# Patient Record
Sex: Male | Born: 1975 | Race: White | Hispanic: No | Smoking: Former smoker
Health system: Southern US, Community
[De-identification: ages and names within clinical notes are randomized; demographics above are authoritative.]

## PROBLEM LIST (undated history)

## (undated) DIAGNOSIS — F419 Anxiety disorder, unspecified: Secondary | ICD-10-CM

## (undated) DIAGNOSIS — I10 Essential (primary) hypertension: Secondary | ICD-10-CM

## (undated) HISTORY — PX: SHOULDER SURGERY: SHX246

---

## 2013-08-02 ENCOUNTER — Emergency Department: Payer: Self-pay | Admitting: Emergency Medicine

## 2015-02-02 ENCOUNTER — Emergency Department: Payer: 59

## 2015-02-02 ENCOUNTER — Emergency Department
Admission: EM | Admit: 2015-02-02 | Discharge: 2015-02-02 | Disposition: A | Discharge status: Home / Self Care | Payer: 59 | Attending: Student | Admitting: Student

## 2015-02-02 ENCOUNTER — Encounter: Payer: Self-pay | Admitting: Emergency Medicine

## 2015-02-02 DIAGNOSIS — Z720 Tobacco use: Secondary | ICD-10-CM | POA: Insufficient documentation

## 2015-02-02 DIAGNOSIS — Y9289 Other specified places as the place of occurrence of the external cause: Secondary | ICD-10-CM | POA: Diagnosis not present

## 2015-02-02 DIAGNOSIS — S46911A Strain of unspecified muscle, fascia and tendon at shoulder and upper arm level, right arm, initial encounter: Secondary | ICD-10-CM | POA: Insufficient documentation

## 2015-02-02 DIAGNOSIS — Y9389 Activity, other specified: Secondary | ICD-10-CM | POA: Insufficient documentation

## 2015-02-02 DIAGNOSIS — Y998 Other external cause status: Secondary | ICD-10-CM | POA: Insufficient documentation

## 2015-02-02 DIAGNOSIS — X58XXXA Exposure to other specified factors, initial encounter: Secondary | ICD-10-CM | POA: Diagnosis not present

## 2015-02-02 DIAGNOSIS — S4991XA Unspecified injury of right shoulder and upper arm, initial encounter: Secondary | ICD-10-CM | POA: Diagnosis present

## 2015-02-02 MED ORDER — IBUPROFEN 600 MG PO TABS: 600.0000 mg | ORAL_TABLET | Freq: Four times a day (QID) | ORAL | Status: DC | PRN

## 2015-02-02 MED ORDER — TRAMADOL HCL 50 MG PO TABS: 50.0000 mg | ORAL_TABLET | Freq: Three times a day (TID) | ORAL | Status: DC | PRN

## 2015-02-02 MED ORDER — MORPHINE SULFATE 4 MG/ML IJ SOLN
4.0000 mg | Freq: Once | INTRAMUSCULAR | Status: AC
  Administered 2015-02-02: 4 mg via INTRAVENOUS

## 2015-02-02 MED ORDER — MORPHINE SULFATE 4 MG/ML IJ SOLN
INTRAMUSCULAR | Status: AC
  Administered 2015-02-02: 4 mg via INTRAVENOUS
  Filled 2015-02-02: qty 1

## 2015-02-02 MED ORDER — ONDANSETRON HCL 4 MG/2ML IJ SOLN
4.0000 mg | Freq: Once | INTRAMUSCULAR | Status: AC
  Administered 2015-02-02: 4 mg via INTRAVENOUS

## 2015-02-02 MED ORDER — ONDANSETRON HCL 4 MG/2ML IJ SOLN
INTRAMUSCULAR | Status: AC
  Administered 2015-02-02: 4 mg via INTRAVENOUS
  Filled 2015-02-02: qty 2

## 2015-02-02 NOTE — ED Notes (Signed)
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

## 2015-02-02 NOTE — ED Notes (Signed)
AAOx3.  Skin warm and dry.  Patient posture relaxed.  Appears to be in no acute distress.  D/C instructions given. Patient d/c home with ride.

## 2015-02-02 NOTE — ED Provider Notes (Signed)
[facility] Emergency Department Provider Note  ____________________________________________  Time seen: Approximately 5:26 PM  I have reviewed the triage vital signs and the nursing notes.   HISTORY  Chief Complaint Shoulder Pain    HPI James Tapia is a 39 y.o. 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 The patient reports that he was loading a lawnmower onto a truck when the lawnmower "came back at me". He tried to steady it with his hands and "heard a pop" in the right shoulder. He is concerned it may be dislocated. He has suffered multiple dislocations before in the past. He has no numbness or weakness in the hand. Current pain severity is moderate. Movement makes the pain worse.   History reviewed. No pertinent past medical history.  There are no active problems to display for this patient.   History reviewed. No pertinent past surgical history.  No current outpatient prescriptions on file.  Allergies Nsaids  No family history on file.  Social History History  Substance Use Topics  . Smoking status: Current Every Day Smoker  . Smokeless tobacco: Not on file  . Alcohol Use: No    Review of Systems Constitutional: No fever/chills Eyes: No visual changes. ENT: No sore throat. Cardiovascular: Denies chest pain. Respiratory: Denies shortness of breath. Gastrointestinal: No abdominal pain.  No nausea, no vomiting.  No diarrhea.  No constipation. Genitourinary: Negative for dysuria. Musculoskeletal: Negative for back pain. Skin: Negative for rash. Neurological: Negative for headaches, focal weakness or numbness.  10-point ROS otherwise negative.  ____________________________________________   PHYSICAL EXAM:  VITAL SIGNS: ED Triage Vitals  Enc Vitals Group     BP 02/02/15 1719 135/81 mmHg     Pulse Rate 02/02/15 1719  93     Resp --      Temp 02/02/15 1719 98.5 F (36.9 C)     Temp Source 02/02/15 1719 Oral     SpO2 02/02/15 1719 98 %     Weight 02/02/15 1719 235 lb (106.595 kg)     Height 02/02/15 1719 6' (1.829 m)     Head Cir --      Peak Flow --      Pain Score 02/02/15 1719 10     Pain Loc --      Pain Edu? --      Excl. in GC? --     Constitutional: Alert and oriented. Well appearing and in no acute distress. Eyes: Conjunctivae are normal. PERRL. EOMI. Head: Atraumatic. Nose: No congestion/rhinnorhea. Mouth/Throat: Mucous membranes are moist.  Oropharynx non-erythematous. Neck: No stridor.   Cardiovascular: Normal rate, regular rhythm. Grossly normal heart sounds.  Good peripheral circulation. Respiratory: Normal respiratory effort.  No retractions. Lungs CTAB. Gastrointestinal: Soft and nontender. No distention. No abdominal bruits. No CVA tenderness. Genitourinary: deferred Musculoskeletal: No bony tenderness associated with the right shoulder and the patient has full although painful passive range of motion of the right shoulder, 2+ right radial pulse, wiggles the fingers, radial/median/ulnar nerve are intact. Neurologic:  Normal speech and language. No gross focal neurologic deficits are appreciated. Speech is normal. No gait instability. Skin:  Skin is warm, dry and intact. No rash noted. Psychiatric: Mood and affect are normal. Speech and behavior are normal.  ____________________________________________   LABS (all labs ordered are listed, but only abnormal results are displayed)  Labs Reviewed - No data to display ____________________________________________  EKG  none  ____________________________________________  RADIOLOGY  FINDINGS: There is no evidence of fracture or dislocation. There is no evidence of arthropathy or other focal bone abnormality. Soft tissues are  unremarkable.  IMPRESSION: Negative. ____________________________________________   PROCEDURES  Procedure(s) performed: None  Critical Care performed: No  ____________________________________________   INITIAL IMPRESSION / ASSESSMENT AND PLAN / ED COURSE  Pertinent labs & imaging results that were available during my care of the patient were reviewed by me and considered in my medical decision making (see chart for details).  James Tapia is a 39 y.o. 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 On exam, he is very well-appearing and in no acute distress. His pain has improved significantly after 1 dose of morphine. Neurovascularly intact in the right upper extremity. Physical exam and x-rays not consistent with dislocation or fracture. Discussed symptomatic care, anti-inflammatories, follow-up with orthopedic surgery. Discussed return precautions and patient is comfortable with discharge plan. ____________________________________________   FINAL CLINICAL IMPRESSION(S) / ED DIAGNOSES  Final diagnoses:  Shoulder strain, right, initial encounter      Gayla Doss, MD 02/02/15 1858

## 2015-04-20 ENCOUNTER — Encounter: Payer: Self-pay | Admitting: Emergency Medicine

## 2015-04-20 ENCOUNTER — Ambulatory Visit
Admission: EM | Admit: 2015-04-20 | Discharge: 2015-04-20 | Disposition: A | Discharge status: Home / Self Care | Payer: PRIVATE HEALTH INSURANCE | Attending: Family Medicine | Admitting: Family Medicine

## 2015-04-20 DIAGNOSIS — S46911A Strain of unspecified muscle, fascia and tendon at shoulder and upper arm level, right arm, initial encounter: Secondary | ICD-10-CM

## 2015-04-20 MED ORDER — CYCLOBENZAPRINE HCL 5 MG PO TABS: 5.0000 mg | ORAL_TABLET | Freq: Three times a day (TID) | ORAL | Status: DC | PRN

## 2015-04-20 NOTE — Discharge Instructions (Signed)
Shoulder Range of Motion Exercises °The shoulder is the most flexible joint in the human body. Because of this it is also the most unstable joint in the body. All ages can develop shoulder problems. Early treatment of problems is necessary for a good outcome. People react to shoulder pain by decreasing the movement of the joint. After a brief period of time, the shoulder can become "frozen". This is an almost complete loss of the ability to move the damaged shoulder. Following injuries your caregivers can give you instructions on exercises to keep your range of motion (ability to move your shoulder freely), or regain it if it has been lost.  °EXERCISES °EXERCISES TO MAINTAIN THE MOBILITY OF YOUR SHOULDER: °Codman's Exercise or Pendulum Exercise °· This exercise may be performed in a prone (face-down) lying position or standing while leaning on a chair with the opposite arm. Its purpose is to relax the muscles in your shoulder and slowly but surely increase the range of motion and to relieve pain. °· Lie on your stomach close to the side edge of the bed. Let your weak arm hang over the edge of the bed. Relax your shoulder, arm and hand. Let your shoulder blade relax and drop down. °· Slowly and gently swing your arm forward and back. Do not use your neck muscles; relax them. It might be easier to have someone else gently start swinging your arm. °· As pain decreases, increase your swing. To start, arm swing should begin at 15 degree angles. In time and as pain lessens, move to 30-45 degree angles. Start with swinging for about 15 seconds, and work towards swinging for 3 to 5 minutes. °· This exercise may also be performed in a standing/bent over position. °· Stand and hold onto a sturdy chair with your good arm. Bend forward at the waist and bend your knees slightly to help protect your back. Relax your weak arm, let it hang limp. Relax your shoulder blade and let it drop. °· Keep your shoulder relaxed and use body  motion to swing your arm in small circles. °· Stand up tall and relax. °· Repeat motion and change direction of circles. °· Start with swinging for about 30 seconds, and work towards swinging for 3 to 5 minutes. °STRETCHING EXERCISES: °· Lift your arm out in front of you with the elbow bent at 90 degrees. Using your other arm gently pull the elbow forward and across your body. °· Bend one arm behind you with the palm facing outward. Using the other arm, hold a towel or rope and reach this arm up above your head, then bend it at the elbow to move your wrist to behind your neck. Grab the free end of the towel with the hand behind your back. Gently pull the towel up with the hand behind your neck, gradually increasing the pull on the hand behind the small of your back. Then, gradually pull down with the hand behind the small of your back. This will pull the hand and arm behind your neck further. Both shoulders will have an increased range of motion with repetition of this exercise. °STRENGTHENING EXERCISES: °· Standing with your arm at your side and straight out from your shoulder with the elbow bent at 90 degrees, hold onto a small weight and slowly raise your hand so it points straight up in the air. Repeat this five times to begin with, and gradually increase to ten times. Do this four times per day. As you grow   stronger you can gradually increase the weight.  Repeat the above exercise, only this time using an elastic band. Start with your hand up in the air and pull down until your hand is by your side. As you grow stronger, gradually increase the amount you pull by increasing the number or size of the elastic bands. Use the same amount of repetitions.  Standing with your hand at your side and holding onto a weight, gradually lift the hand in front of you until it is over your head. Do the same also with the hand remaining at your side and lift the hand away from your body until it is again over your head.  Repeat this five times to begin with, and gradually increase to ten times. Do this four times per day. As you grow stronger you can gradually increase the weight. Document Released: 04/09/2003 Document Revised: 07/16/2013 Document Reviewed: 07/11/2005 [facility] Patient Information 2015 [place], [place]. This information is not intended to replace advice given to you by your health care provider. Make sure you discuss any questions you have with your health care provider. Shoulder Sprain A shoulder sprain is the result of damage to the tough, fiber-like tissues (ligaments) that help hold your shoulder in place. The ligaments may be stretched or torn. Besides the main shoulder joint (the ball and socket), there are several smaller joints that connect the bones in this area. A sprain usually involves one of those joints. Most often it is the acromioclavicular (or AC) joint. That is the joint that connects the collarbone (clavicle) and the shoulder blade (scapula) at the top point of the shoulder blade (acromion). A shoulder sprain is a mild form of what is called a shoulder separation. Recovering from a shoulder sprain may take some time. For some, pain lingers for several months. Most people recover without long term problems. CAUSES   A shoulder sprain is usually caused by some kind of trauma. This might be:  Falling on an outstretched arm.  Being hit hard on the shoulder.  Twisting the arm.  Shoulder sprains are more likely to occur in people who:  Play sports.  Have balance or coordination problems. SYMPTOMS   Pain when you move your shoulder.  Limited ability to move the shoulder.  Swelling and tenderness on top of the shoulder.  Redness or warmth in the shoulder.  Bruising.  A change in the shape of the shoulder. DIAGNOSIS  Your healthcare provider may:  Ask about your symptoms.  Ask about recent activity that might have caused those symptoms.  Examine your shoulder. You may  be asked to do simple exercises to test movement. The other shoulder will be examined for comparison.  Order some tests that provide a look inside the body. They can show the extent of the injury. The tests could include:  X-rays.  CT (computed tomography) scan.  MRI (magnetic resonance imaging) scan. RISKS AND COMPLICATIONS  Loss of full shoulder motion.  Ongoing shoulder pain. TREATMENT  How long it takes to recover from a shoulder sprain depends on how severe it was. Treatment options may include:  Rest. You should not use the arm or shoulder until it heals.  Ice. For 2 or 3 days after the injury, put an ice pack on the shoulder up to 4 times a day. It should stay on for 15 to 20 minutes each time. Wrap the ice in a towel so it does not touch your skin.  Over-the-counter medicine to relieve pain.  A  sling or brace. This will keep the arm still while the shoulder is healing.  Physical therapy or rehabilitation exercises. These will help you regain strength and motion. Ask your healthcare provider when it is OK to begin these exercises.  Surgery. The need for surgery is rare with a sprained shoulder, but some people may need surgery to keep the joint in place and reduce pain. HOME CARE INSTRUCTIONS   Ask your healthcare provider about what you should and should not do while your shoulder heals.  Make sure you know how to apply ice to the correct area of your shoulder.  Talk with your healthcare provider about which medications should be used for pain and swelling.  If rehabilitation therapy will be needed, ask your healthcare provider to refer you to a therapist. If it is not recommended, then ask about at-home exercises. Find out when exercise should begin. SEEK MEDICAL CARE IF:  Your pain, swelling, or redness at the joint increases. SEEK IMMEDIATE MEDICAL CARE IF:   You have a fever.  You cannot move your arm or shoulder. Document Released: 11/27/2008 Document Revised:  10/03/2011 Document Reviewed: 11/27/2008 [facility] Patient Information 2015 [place], [place]. This information is not intended to replace advice given to you by your health care provider. Make sure you discuss any questions you have with your health care provider.

## 2015-04-20 NOTE — ED Provider Notes (Signed)
CSN: [id number]     Arrival date & time 04/20/15  1046 History   First MD Initiated Contact with Patient 04/20/15 1251     Chief Complaint  Patient presents with  . Shoulder Pain   (Consider location/radiation/quality/duration/timing/severity/associated sxs/prior Treatment) HPI Comments: Married caucasian male here for re-evaluation of right shoulder pain.  Currently no PCM uses ED/[facility] prn.  Last seen ED 02/02/2015 injured right shoulder lifting mower into his truck.  Xray normal negative.  Pain never resolved was given referral to surgeon and patient did not go.  Has had previous surgeries to right shoulder in [place] biceps tendon/impingment release worried this last lifting incident hurt something from previous surgery.  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  Cannot take time off work for surgery right now.  Cannot take nsaids as allergy "swells up" and/or hurts his stomach.  Patient reported history of pain management contract in the past.  Works for [employer].  On Friday 24 Sep was reaching for TV remote behind his head on couch table and sharp pain now decreased range of motion, dropped glass in kitchen this weekend weak grasp has tried excedrin with relief of pain.  Occasional shooting pains to right fingertips with movement of right arm about shoulder level.    Patient is a 39 y.o. male presenting with shoulder pain. The history is provided by the patient.  Shoulder Pain Location:  Shoulder Time since incident:  2 months Injury: yes   Shoulder location:  R shoulder Pain details:    Quality:  Tingling, shooting and aching   Radiates to:  R fingers   Severity:  Moderate   Onset quality:  Sudden   Duration:  2 months   Timing:  Intermittent   Progression:  Worsening Chronicity:  Recurrent Handedness:  Right-handed Dislocation: no   Foreign body present:  No foreign bodies Prior injury to area:  Yes Relieved by:  Rest Worsened by:  Movement, stretching  area and exercise Ineffective treatments:  Ice, heat, being still, rest and immobilization Associated symptoms: decreased range of motion, muscle weakness and tingling   Associated symptoms: no back pain, no fatigue, no fever, no neck pain, no numbness, no stiffness and no swelling   Risk factors: no concern for non-accidental trauma, no known bone disorder, no frequent fractures and no recent illness     History reviewed. No pertinent past medical history. Past Surgical History  Procedure Laterality Date  . Shoulder surgery Right    History reviewed. No pertinent family history. Social History  Substance Use Topics  . Smoking status: Current Every Day Smoker  . Smokeless tobacco: None  . Alcohol Use: Yes    Review of Systems  Constitutional: Negative for fever, chills, diaphoresis, activity change, appetite change and fatigue.  HENT: Negative for congestion, dental problem, drooling, ear discharge, ear pain, facial swelling, hearing loss, trouble swallowing and voice change.   Eyes: Negative for photophobia, pain, discharge, redness, itching and visual disturbance.  Respiratory: Negative for cough, shortness of breath, wheezing and stridor.   Cardiovascular: Negative for chest pain and leg swelling.  Gastrointestinal: Negative for vomiting, abdominal pain, diarrhea, constipation, blood in stool, abdominal distention and rectal pain.  Endocrine: Negative for cold intolerance and heat intolerance.  Musculoskeletal: Positive for myalgias and arthralgias. Negative for back pain, joint swelling, gait problem, stiffness, neck pain and neck stiffness.  Skin: Negative for color change, pallor, rash and wound.  Allergic/Immunologic: Negative for environmental allergies and food allergies.  Neurological: Positive for weakness and numbness. Negative for dizziness, tremors, seizures, syncope, facial asymmetry, speech difficulty, light-headedness and headaches.  Hematological: Negative for  adenopathy. Does not bruise/bleed easily.  Psychiatric/Behavioral: Negative for behavioral problems, confusion, sleep disturbance and agitation.    Allergies  Ibuprofen and Nsaids  Home Medications   Prior to Admission medications   Medication Sig Start Date End Date Taking? Authorizing Provider  cyclobenzaprine (FLEXERIL) 5 MG tablet Take 1 tablet (5 mg total) by mouth 3 (three) times daily as needed for muscle spasms. 04/20/15   Barbaraann Barthel, NP   Meds Ordered and Administered this Visit  Medications - No data to display  BP 129/88 mmHg  Pulse 86  Temp(Src) 97.9 F (36.6 C) (Tympanic)  Resp 18  Ht  (1.854 m)  Wt 230 lb (104.327 kg)  BMI 30.35 kg/m2  SpO2 100% No data found.   Physical Exam  Constitutional: He is oriented to person, place, and time. Vital signs are normal. He appears well-developed and well-nourished. No distress.  HENT:  Head: Normocephalic and atraumatic.  Right Ear: External ear normal.  Left Ear: External ear normal.  Nose: Nose normal.  Mouth/Throat: Oropharynx is clear and moist. No oropharyngeal exudate.  Eyes: Conjunctivae, EOM and lids are normal. Pupils are equal, round, and reactive to light. Right eye exhibits no discharge. Left eye exhibits no discharge. No scleral icterus.  Neck: Trachea normal and normal range of motion. Neck supple. No tracheal tenderness, no spinous process tenderness and no muscular tenderness present. No rigidity. No tracheal deviation, no edema, no erythema and normal range of motion present. No thyroid mass and no thyromegaly present.  Cardiovascular: Normal rate, regular rhythm, normal heart sounds and intact distal pulses.  Exam reveals no gallop and no friction rub.   No murmur heard. Pulmonary/Chest: Effort normal and breath sounds normal. No stridor. No respiratory distress. He has no wheezes. He has no rales.  Abdominal: Soft. He exhibits no distension.  Musculoskeletal: He exhibits tenderness. He  exhibits no edema.       Right shoulder: He exhibits decreased range of motion, tenderness, bony tenderness, pain and spasm. He exhibits no swelling, no effusion, no crepitus, no deformity, no laceration, normal pulse and normal strength.       Left shoulder: Normal.       Right elbow: Normal.      Left elbow: Normal.       Right wrist: Normal.       Left wrist: Normal.       Cervical back: Normal.       Thoracic back: Normal.       Lumbar back: Normal.       Arms: Neurological: He is alert and oriented to person, place, and time. He has normal reflexes. He displays normal reflexes. He exhibits normal muscle tone. Coordination normal.  Skin: Skin is warm, dry and intact. No rash noted. He is not diaphoretic. No erythema. No pallor.  Psychiatric: He has a normal mood and affect. His speech is normal and behavior is normal. Judgment and thought content normal. Cognition and memory are normal.  Nursing note and vitals reviewed.   ED Course  Procedures (including critical care time)  Labs Review Labs Reviewed - No data to display  Imaging Review No results found.  Reviewed [place] Controlled Substances Reporting System no Rx in past year noted   MDM   1. Right shoulder strain, initial encounter    Flexeril  po TID prn muscle  spasms.  Patient refused all nsaids po or topical (tylenol, ultram, voltaren, mobic, naproxen, motrin, celebrex) and did not want narcotics, history of pain management contract in past five years per patient.  Allergic to NSAIDS and history of stomach upset with most pain medications.   Discussed avoid alcohol use and avoid driving within 8 hours of first dose until known side effects e.g. May cause drowsiness/sedation.  PT referral Rx given to patient for home exercise program.  Discussed with patient recommended following up with orthopedics has referral from ED and to check with his insurance regarding referral requirements e.g self referral versus PCM versus ED/[facility]CC  referrals and co pays associated with each.  Home stretches demonstrated to patient-e.g. Arm circles, walking up wall, chest stretches, neck AROM, chin tucks, knee to chest and rock side to side on back. Discussed work lifting limitations and patient refused.  Discussed lifting heavy loads may impair shoulder strain healing.  Patient was instructed to rest, ice, and ROM exercises.  Activity as tolerated.  Avoid alcohol intake while taking flexeril.  Follow up if symptoms persist or worsen.  Exitcare handout on shoulder strain, shoulder exercises given to patient.  Patient verbalized agreement and understanding of treatment plan.  P2:  Injury Prevention and Fitness.    Barbaraann Barthel, NP 04/20/15 [phone]

## 2015-04-20 NOTE — ED Notes (Signed)
Patient states he has a history of right shoulder injury and surgery. Friday evening it started hurting again, he could barely move right arm on Saturday.

## 2015-08-09 ENCOUNTER — Encounter: Payer: Self-pay | Admitting: Emergency Medicine

## 2015-08-09 ENCOUNTER — Emergency Department
Admission: EM | Admit: 2015-08-09 | Discharge: 2015-08-09 | Disposition: A | Discharge status: Home / Self Care | Payer: 59 | Attending: Emergency Medicine | Admitting: Emergency Medicine

## 2015-08-09 DIAGNOSIS — K047 Periapical abscess without sinus: Secondary | ICD-10-CM | POA: Diagnosis not present

## 2015-08-09 DIAGNOSIS — K029 Dental caries, unspecified: Secondary | ICD-10-CM | POA: Insufficient documentation

## 2015-08-09 DIAGNOSIS — F17200 Nicotine dependence, unspecified, uncomplicated: Secondary | ICD-10-CM | POA: Diagnosis not present

## 2015-08-09 DIAGNOSIS — G8929 Other chronic pain: Secondary | ICD-10-CM | POA: Insufficient documentation

## 2015-08-09 DIAGNOSIS — K0889 Other specified disorders of teeth and supporting structures: Secondary | ICD-10-CM | POA: Diagnosis present

## 2015-08-09 MED ORDER — PENICILLIN V POTASSIUM 500 MG PO TABS
500.0000 mg | ORAL_TABLET | Freq: Once | ORAL | Status: AC
  Administered 2015-08-09: 500 mg via ORAL
  Filled 2015-08-09: qty 1

## 2015-08-09 MED ORDER — PENICILLIN V POTASSIUM 500 MG PO TABS: 500.0000 mg | ORAL_TABLET | Freq: Four times a day (QID) | ORAL | Status: DC

## 2015-08-09 MED ORDER — BUPIVACAINE HCL 0.5 % IJ SOLN
5.0000 mL | Freq: Once | INTRAMUSCULAR | Status: AC
  Administered 2015-08-09: 5 mL
  Filled 2015-08-09: qty 5

## 2015-08-09 MED ORDER — BUPIVACAINE HCL (PF) 0.5 % IJ SOLN
INTRAMUSCULAR | Status: AC
  Filled 2015-08-09: qty 30

## 2015-08-09 MED ORDER — TRAMADOL HCL 50 MG PO TABS: 50.0000 mg | ORAL_TABLET | Freq: Four times a day (QID) | ORAL | Status: DC | PRN

## 2015-08-09 MED ORDER — LIDOCAINE HCL (PF) 1 % IJ SOLN
2.0000 mL | Freq: Once | INTRAMUSCULAR | Status: AC
  Administered 2015-08-09: 2 mL
  Filled 2015-08-09: qty 5

## 2015-08-09 NOTE — ED Provider Notes (Signed)
CSN: [id number]     Arrival date & time 08/09/15  1030 History   First MD Initiated Contact with Patient 08/09/15 1034     Chief Complaint  Patient presents with  . Dental Pain     (Consider location/radiation/quality/duration/timing/severity/associated sxs/prior Treatment) HPI  40 year old male presents to emergency department for evaluation of dental pain. He's had dental pain present for 3-4 days. He denies any fevers, drainage, nausea, vomiting. He is tolerating by mouth well. Pain is severe 10 out of 10 and not relieved with Tylenol. He describes the pain as a constant ache. No numbness or tingling. He has a history of frequent dental infections.  History reviewed. No pertinent past medical history. Past Surgical History  Procedure Laterality Date  . Shoulder surgery Right    History reviewed. No pertinent family history. Social History  Substance Use Topics  . Smoking status: Current Every Day Smoker  . Smokeless tobacco: None  . Alcohol Use: Yes    Review of Systems  Constitutional: Negative.  Negative for fever, chills, activity change and appetite change.  HENT: Positive for dental problem. Negative for congestion, drooling, ear pain, facial swelling, mouth sores, rhinorrhea, sinus pressure, sore throat, trouble swallowing and voice change.   Eyes: Negative for photophobia, pain and discharge.  Respiratory: Negative for cough, chest tightness and shortness of breath.   Cardiovascular: Negative for chest pain and leg swelling.  Gastrointestinal: Negative for nausea, vomiting, abdominal pain, diarrhea and abdominal distention.  Genitourinary: Negative for dysuria and difficulty urinating.  Musculoskeletal: Negative for back pain, arthralgias, gait problem, neck pain and neck stiffness.  Skin: Negative.  Negative for color change and rash.  Neurological: Negative for dizziness and headaches.  Hematological: Negative for adenopathy.  Psychiatric/Behavioral: Negative for  behavioral problems, confusion and agitation.  All other systems reviewed and are negative.     Allergies  Ibuprofen and Nsaids  Home Medications   Prior to Admission medications   Medication Sig Start Date End Date Taking? Authorizing Provider  cyclobenzaprine (FLEXERIL) 5 MG tablet Take 1 tablet (5 mg total) by mouth 3 (three) times daily as needed for muscle spasms. 04/20/15   Barbaraann Barthel, NP  penicillin v potassium (VEETID) 500 MG tablet Take 1 tablet (500 mg total) by mouth 4 (four) times daily. 08/09/15   Evon Slack, PA-C  traMADol (ULTRAM) 50 MG tablet Take 1 tablet (50 mg total) by mouth every 6 (six) hours as needed. 08/09/15   Evon Slack, PA-C   There were no vitals taken for this visit. Physical Exam  Constitutional: He is oriented to person, place, and time. He appears well-developed and well-nourished. No distress.  HENT:  Head: Normocephalic and atraumatic.  Right Ear: External ear normal.  Left Ear: External ear normal.  Nose: Nose normal.  Mouth/Throat: Uvula is midline and oropharynx is clear and moist. No oral lesions. No trismus in the jaw. Normal dentition. Dental abscesses and dental caries present. No uvula swelling.    Eyes: EOM are normal.  Neck: Normal range of motion. Neck supple.  Cardiovascular: Normal rate.  Exam reveals no gallop and no friction rub.   No murmur heard. Pulmonary/Chest: Effort normal and breath sounds normal. No respiratory distress.  Neurological: He is alert and oriented to person, place, and time.  Skin: Skin is warm and dry.  Psychiatric: He has a normal mood and affect. His behavior is normal. Thought content normal.    ED Course  Procedures (including critical care time) Left mandibular  nerve block: Patient sent in a upright position, patient was injected around the mandibular nerve with a bolus solution of 5 cc of 1% lidocaine without epinephrine, 5 cc of 0.5% bupivacaine. Patient tolerated the procedure well.  Pain relief was achieved.  Labs Review Labs Reviewed - No data to display  Imaging Review No results found. I have personally reviewed and evaluated these images and lab results as part of my medical decision-making.   EKG Interpretation None      MDM   Final diagnoses:  Dental infection  Pain, dental    40 year old male with 3 days of acute on chronic dental pain. Vital signs stable, patient afebrile. Dental block was offered, patient refused. Patient is placed on penicillin VK 1 tablet by mouth every 6 hours 10 days. He'll continue Tylenol for pain. Tramadol was given for additional pain relief.    Evon Slackhomas C Ashon Rosenberg, PA-C 08/09/15 1043  Evon Slackhomas C Brisha Mccabe, PA-C 08/09/15 1052  Governor Rooksebecca Lord, MD 08/09/15 [id number]

## 2015-08-09 NOTE — Discharge Instructions (Signed)
Dental Pain °Dental pain may be caused by many things, including: °· Tooth decay (cavities or caries). Cavities cause the nerve of your tooth to be open to air and hot or cold temperatures. This can cause pain or discomfort. °· Abscess or infection. A dental abscess is an area that is full of infected pus from a bacterial infection in the inner part of the tooth (pulp). It usually happens at the end of the tooth's root. °· Injury. °· An unknown reason (idiopathic). °Your pain may be mild or severe. It may only happen when: °· You are chewing. °· You are exposed to hot or cold temperature. °· You are eating or drinking sugary foods or beverages, such as: °¨ Soda. °¨ Candy. °Your pain may also be there all of the time. °HOME CARE °Watch your dental pain for any changes. Do these things to lessen your discomfort: °· Take medicines only as told by your dentist. °· If your dentist tells you to take an antibiotic medicine, finish all of it even if you start to feel better. °· Keep all follow-up visits as told by your dentist. This is important. °· Do not apply heat to the outside of your face. °· Rinse your mouth or gargle with salt water if told by your dentist. This helps with pain and swelling. °¨ You can make salt water by adding ¼ tsp of salt to 1 cup of warm water. °· Apply ice to the painful area of your face: °¨ Put ice in a plastic bag. °¨ Place a towel between your skin and the bag. °¨ Leave the ice on for 20 minutes, 2-3 times per day. °· Avoid foods or drinks that cause you pain, such as: °¨ Very hot or very cold foods or drinks. °¨ Sweet or sugary foods or drinks. °GET HELP IF: °· Your pain is not helped with medicines. °· Your symptoms are worse. °· You have new symptoms. °GET HELP RIGHT AWAY IF: °· You cannot open your mouth. °· You are having trouble breathing or swallowing. °· You have a fever. °· Your face, neck, or jaw is puffy (swollen). °  °This information is not intended to replace advice given to  you by your health care provider. Make sure you discuss any questions you have with your health care provider. °  °Document Released: 12/28/2007 Document Revised: 11/25/2014 Document Reviewed: 07/07/2014 °Elsevier Interactive Patient Education ©2016 [employer] ° °

## 2015-08-09 NOTE — ED Notes (Signed)
Pt c/o dental pain on the left lower side for 2-3 days.

## 2015-10-30 ENCOUNTER — Telehealth: Payer: Self-pay

## 2015-10-30 NOTE — Telephone Encounter (Signed)
Wrong pt

## 2016-05-04 ENCOUNTER — Emergency Department
Admission: EM | Admit: 2016-05-04 | Discharge: 2016-05-04 | Disposition: A | Discharge status: Home / Self Care | Payer: 59 | Attending: Emergency Medicine | Admitting: Emergency Medicine

## 2016-05-04 ENCOUNTER — Emergency Department: Payer: 59

## 2016-05-04 ENCOUNTER — Encounter: Payer: Self-pay | Admitting: Emergency Medicine

## 2016-05-04 DIAGNOSIS — R079 Chest pain, unspecified: Secondary | ICD-10-CM | POA: Diagnosis present

## 2016-05-04 DIAGNOSIS — F17200 Nicotine dependence, unspecified, uncomplicated: Secondary | ICD-10-CM | POA: Diagnosis not present

## 2016-05-04 DIAGNOSIS — J40 Bronchitis, not specified as acute or chronic: Secondary | ICD-10-CM | POA: Diagnosis not present

## 2016-05-04 DIAGNOSIS — Z79899 Other long term (current) drug therapy: Secondary | ICD-10-CM | POA: Diagnosis not present

## 2016-05-04 LAB — COMPREHENSIVE METABOLIC PANEL
ALT: 24 U/L (ref 17–63)
AST: 22 U/L (ref 15–41)
Albumin: 4.5 g/dL (ref 3.5–5.0)
Alkaline Phosphatase: 73 U/L (ref 38–126)
Anion gap: 8 (ref 5–15)
BILIRUBIN TOTAL: 1 mg/dL (ref 0.3–1.2)
BUN: 13 mg/dL (ref 6–20)
CO2: 26 mmol/L (ref 22–32)
Calcium: 9.3 mg/dL (ref 8.9–10.3)
Chloride: 104 mmol/L (ref 101–111)
Creatinine, Ser: 1.14 mg/dL (ref 0.61–1.24)
GFR calc Af Amer: >60 mL/min (ref >60)
Glucose, Bld: 93 mg/dL (ref 65–99)
POTASSIUM: 4.1 mmol/L (ref 3.5–5.1)
Sodium: 138 mmol/L (ref 135–145)
TOTAL PROTEIN: 7.6 g/dL (ref 6.5–8.1)

## 2016-05-04 LAB — URINALYSIS COMPLETE WITH MICROSCOPIC (ARMC ONLY)
Bacteria, UA: NONE SEEN
Bilirubin Urine: NEGATIVE
Glucose, UA: NEGATIVE mg/dL
HGB URINE DIPSTICK: NEGATIVE
Ketones, ur: NEGATIVE mg/dL
Leukocytes, UA: NEGATIVE
Nitrite: NEGATIVE
Protein, ur: 30 mg/dL — AB
SPECIFIC GRAVITY, URINE: 1.028 (ref 1.005–1.030)
pH: 5 (ref 5.0–8.0)

## 2016-05-04 LAB — CBC
HCT: 47.3 % (ref 40.0–52.0)
Hemoglobin: 16.6 g/dL (ref 13.0–18.0)
MCH: 29.9 pg (ref 26.0–34.0)
MCHC: 35.1 g/dL (ref 32.0–36.0)
MCV: 85.2 fL (ref 80.0–100.0)
PLATELETS: 204 10*3/uL (ref 150–440)
RBC: 5.55 MIL/uL (ref 4.40–5.90)
RDW: 13.3 % (ref 11.5–14.5)
WBC: 13.2 10*3/uL — ABNORMAL HIGH (ref 3.8–10.6)

## 2016-05-04 LAB — LIPASE, BLOOD: Lipase: 23 U/L (ref 11–51)

## 2016-05-04 LAB — TROPONIN I: Troponin I: <0.03 ng/mL (ref <0.03)

## 2016-05-04 MED ORDER — AZITHROMYCIN 250 MG PO TABS
ORAL_TABLET | ORAL | Status: AC
  Filled 2016-05-04: qty 2

## 2016-05-04 MED ORDER — AZITHROMYCIN 250 MG PO TABS: ORAL_TABLET | ORAL | 0 refills | Status: AC

## 2016-05-04 MED ORDER — AZITHROMYCIN 250 MG PO TABS
500.0000 mg | ORAL_TABLET | Freq: Once | ORAL | Status: AC
  Administered 2016-05-04: 500 mg via ORAL

## 2016-05-04 MED ORDER — ALBUTEROL SULFATE HFA 108 (90 BASE) MCG/ACT IN AERS: 2.0000 | INHALATION_SPRAY | Freq: Four times a day (QID) | RESPIRATORY_TRACT | 0 refills | Status: DC | PRN

## 2016-05-04 NOTE — ED Provider Notes (Signed)
[facility] Emergency Department Provider Note   ____________________________________________   First MD Initiated Contact with Patient 05/04/16 1759     (approximate)  I have reviewed the triage vital signs and the nursing notes.   HISTORY  Chief Complaint Chest Pain   HPI James Tapia is a 40 y.o. male without any chronic medical conditions was presenting to the emergency department with episodes of chest burning over the past 2 days. He says that the pain which she describes as a burning sensation feels deep within his chest and lasts for 1-2 hours at a time. He says that he has an associated cough which is nonproductive. Denies any fever or congestion. Denies any sick contacts. Denies any pain with deep inspiration. Denies any nausea vomiting or diarrhea. Denies any exacerbating factors such as exertion. Denies any radiation of the pain.   History reviewed. No pertinent past medical history.  There are no active problems to display for this patient.   Past Surgical History:  Procedure Laterality Date  . SHOULDER SURGERY Right     Prior to Admission medications   Medication Sig Start Date End Date Taking? Authorizing Provider  cyclobenzaprine (FLEXERIL) 5 MG tablet Take 1 tablet (5 mg total) by mouth 3 (three) times daily as needed for muscle spasms. 04/20/15   Barbaraann Barthelina A Betancourt, NP  penicillin v potassium (VEETID) 500 MG tablet Take 1 tablet (500 mg total) by mouth 4 (four) times daily. 08/09/15   Evon Slackhomas C Gaines, PA-C  traMADol (ULTRAM) 50 MG tablet Take 1 tablet (50 mg total) by mouth every 6 (six) hours as needed. 08/09/15   Evon Slackhomas C Gaines, PA-C    Allergies Ibuprofen and Nsaids  No family history on file.  Social History Social History  Substance Use Topics  . Smoking status: Current Every Day Smoker  . Smokeless tobacco: Not on file  . Alcohol use Yes    Review of Systems Constitutional: No fever/chills Eyes: No visual  changes. ENT: No sore throat. Cardiovascular: as above Respiratory: Denies shortness of breath. Gastrointestinal: No abdominal pain.  No nausea, no vomiting.  No diarrhea.  No constipation. Genitourinary: Negative for dysuria. Musculoskeletal: Negative for back pain. Skin: Negative for rash. Neurological: Negative for headaches, focal weakness or numbness.  10-point ROS otherwise negative.  ____________________________________________   PHYSICAL EXAM:  VITAL SIGNS: ED Triage Vitals  Enc Vitals Group     BP 05/04/16 1703 (!) 142/94     Pulse Rate 05/04/16 1703 (!) 108     Resp 05/04/16 1703 16     Temp 05/04/16 1703 98.5 F (36.9 C)     Temp Source 05/04/16 1703 Oral     SpO2 05/04/16 1703 100 %     Weight 05/04/16 1704 242 lb (109.8 kg)     Height 05/04/16 1704 6\' 1"  (1.854 m)     Head Circumference --      Peak Flow --      Pain Score 05/04/16 1704 3     Pain Loc --      Pain Edu? --      Excl. in GC? --     Constitutional: Alert and oriented. Well appearing and in no acute distress. Eyes: Conjunctivae are normal. PERRL. EOMI. Head: Atraumatic. Nose: No congestion/rhinnorhea. Mouth/Throat: Mucous membranes are moist.  Poor dentition with tartar buildup diffusely. Neck: No stridor.   Cardiovascular: Normal rate, regular rhythm. 82 bpm on my examination. Grossly normal heart sounds.   Respiratory: Normal respiratory effort.  No retractions. Lungs CTAB. Gastrointestinal: Soft and nontender. No distention. No CVA tenderness. Musculoskeletal: No lower extremity tenderness nor edema.  No joint effusions. Neurologic:  Normal speech and language. No gross focal neurologic deficits are appreciated. No gait instability. Skin:  Skin is warm, dry and intact. No rash noted. Psychiatric: Mood and affect are normal. Speech and behavior are normal.  ____________________________________________   LABS (all labs ordered are listed, but only abnormal results are displayed)  Labs  Reviewed  CBC - Abnormal; Notable for the following:       Result Value   WBC 13.2 (*)    All other components within normal limits  URINALYSIS COMPLETEWITH MICROSCOPIC ([facility] ONLY) - Abnormal; Notable for the following:    Color, Urine AMBER (*)    APPearance CLEAR (*)    Protein, ur 30 (*)    Squamous Epithelial / LPF 0-5 (*)    All other components within normal limits  LIPASE, BLOOD  COMPREHENSIVE METABOLIC PANEL  TROPONIN I   ____________________________________________  EKG  ED ECG REPORT I, Arelia Longest, the attending physician, personally viewed and interpreted this ECG.   Date: 05/04/2016  EKG Time: 1705  Rate: 106  Rhythm: sinus tachycardia  Axis: Normal axis  Intervals:none  ST&T Change: No ST segment elevation or depression. No abnormal T-wave inversion.  ____________________________________________  RADIOLOGY  DG Chest 2 View (Accession [id number]) (Order [id number])  Imaging  Date: 05/04/2016 Department: [facility] EMERGENCY DEPARTMENT Released By/Authorizing: Myrna Blazer, MD (auto-released)  Exam Information   Status Exam Begun  Exam Ended   Final [99] 05/04/2016 7:55 PM 05/04/2016 7:58 PM  PACS Images   Show images for DG Chest 2 View  Study Result   CLINICAL DATA:  Chest tightness for 3 days  EXAM: CHEST  2 VIEW  COMPARISON:  None.  FINDINGS: The heart size and mediastinal contours are within normal limits. Both lungs are clear. The visualized skeletal structures are unremarkable.  IMPRESSION: No active cardiopulmonary disease.   Electronically Signed   By: Alcide Clever M.D.   On: 05/04/2016 20:04    ____________________________________________   PROCEDURES  Procedure(s) performed:   Procedures  Critical Care performed:   ____________________________________________   INITIAL IMPRESSION / ASSESSMENT AND PLAN / ED COURSE  Pertinent labs & imaging results that were  available during my care of the patient were reviewed by me and considered in my medical decision making (see chart for details).  ----------------------------------------- 8:23 PM on 05/04/2016 -----------------------------------------  Patient withdraws reassuring workup. Sinus tachycardia has resolved. The patient says that he was feeling very anxious upon his initial arrival.  No pleuritic chest pain. Unlikely to be pulmonary embolus. He says that his mother has similar sensation of burning chest pain when she has bronchitis. The patient is a smoker. I'll discharge him with a Z-Pak as well as a prescription for an inhaler.  Clinical Course     ____________________________________________   FINAL CLINICAL IMPRESSION(S) / ED DIAGNOSES  Bronchitis. Chest pain.    NEW MEDICATIONS STARTED DURING THIS VISIT:  New Prescriptions   No medications on file     Note:  This document was prepared using Dragon voice recognition software and may include unintentional dictation errors.    Myrna Blazer, MD 05/04/16 2024

## 2016-05-04 NOTE — ED Triage Notes (Signed)
Pt reports burning/tightness in chest and lungs x3 days. Pt reports cough x5 days.

## 2016-06-24 IMAGING — CR DG SHOULDER 2+V*R*
3 series · 3 of 3 positions shown · non-contrast
Comparison: None.

CLINICAL DATA: Hit by lawn mower. Unable to move right arm without
pain.

EXAM:
RIGHT SHOULDER - 2+ VIEW

[shoulder grashey]
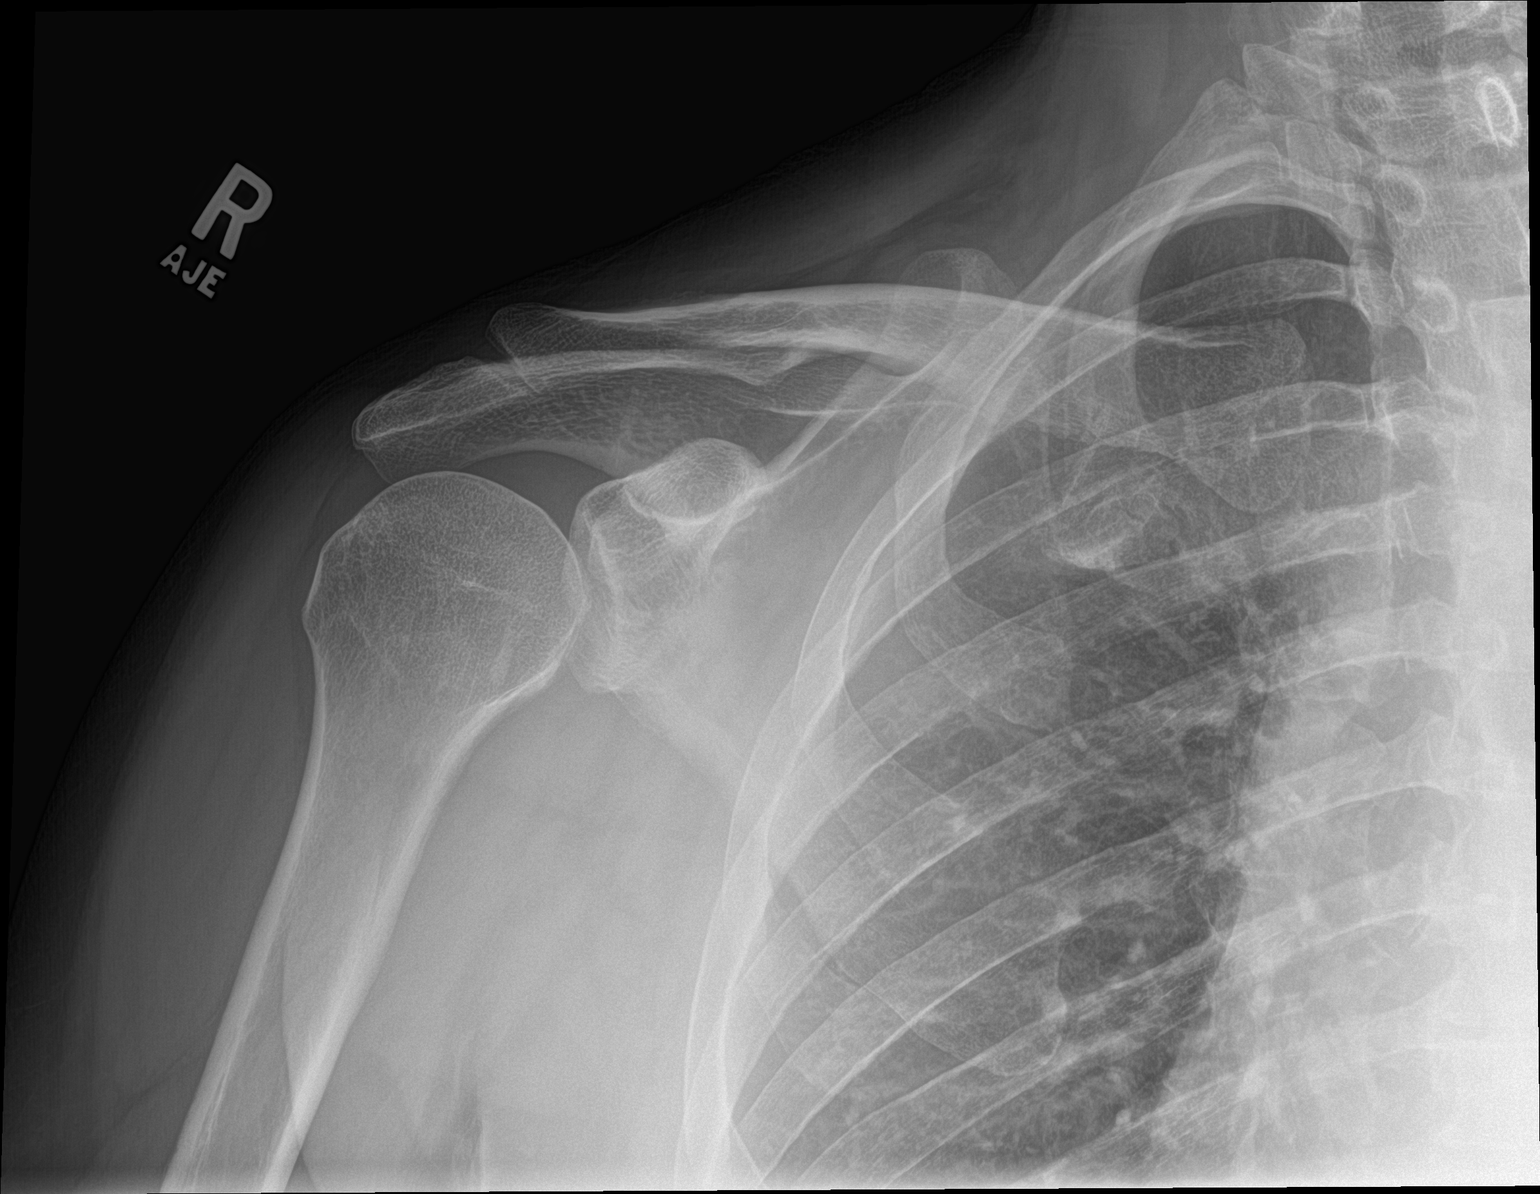

[shoulder y view]
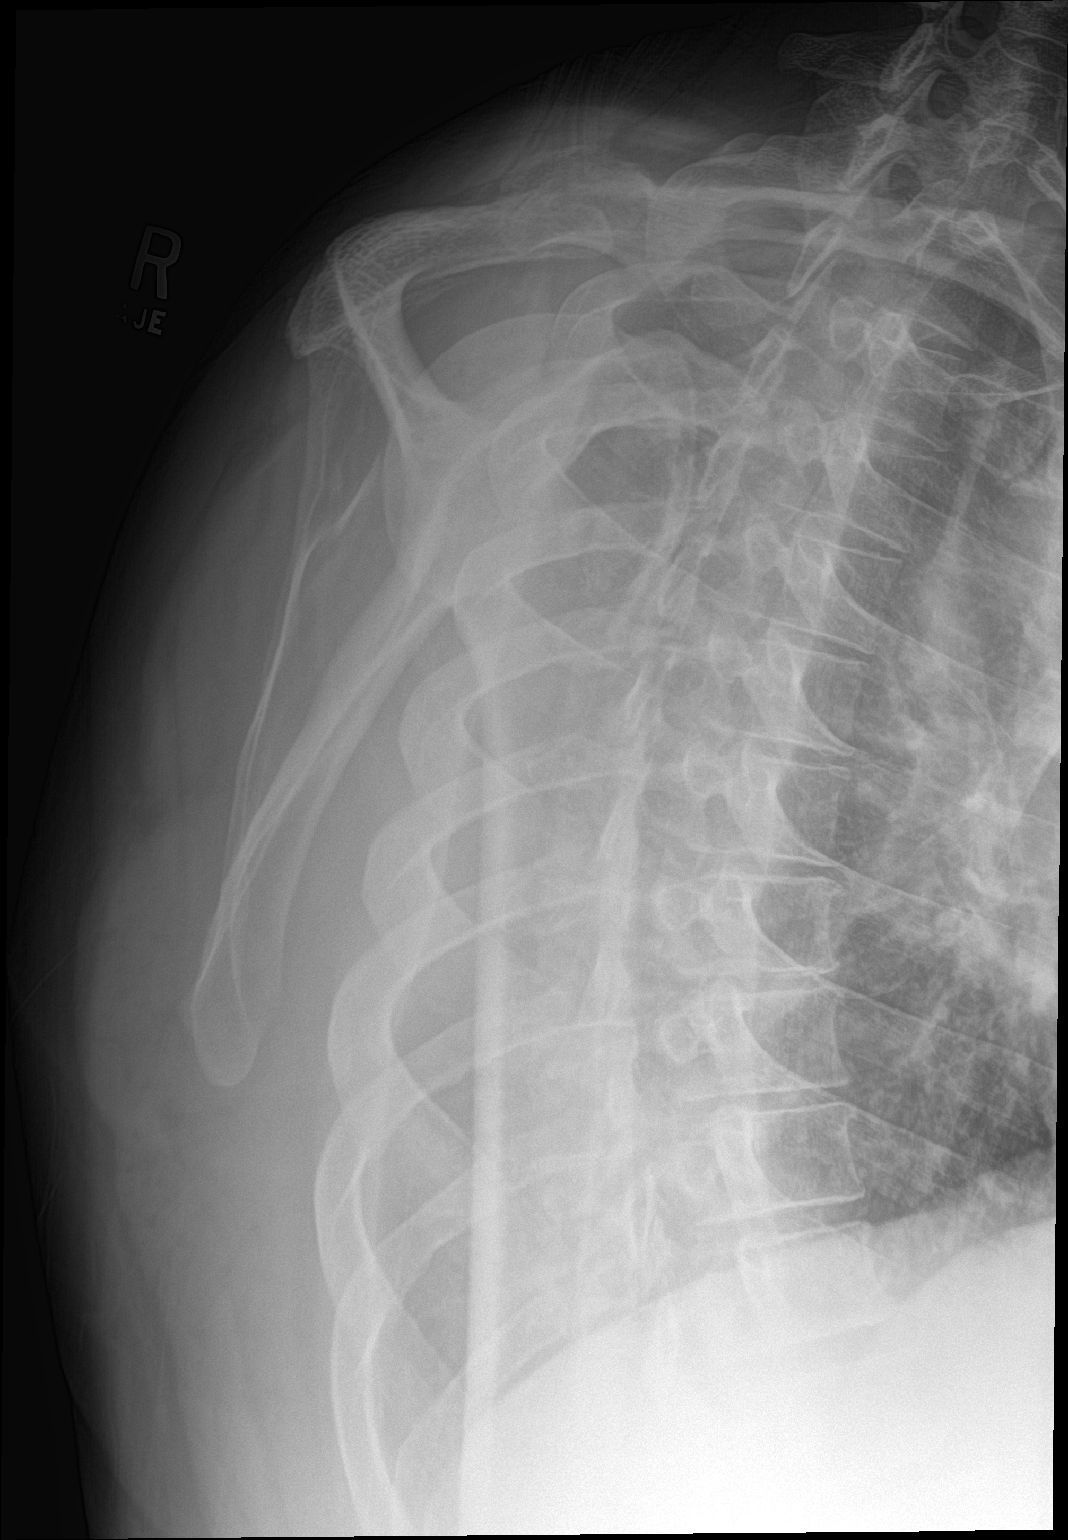

[shoulder axillary]
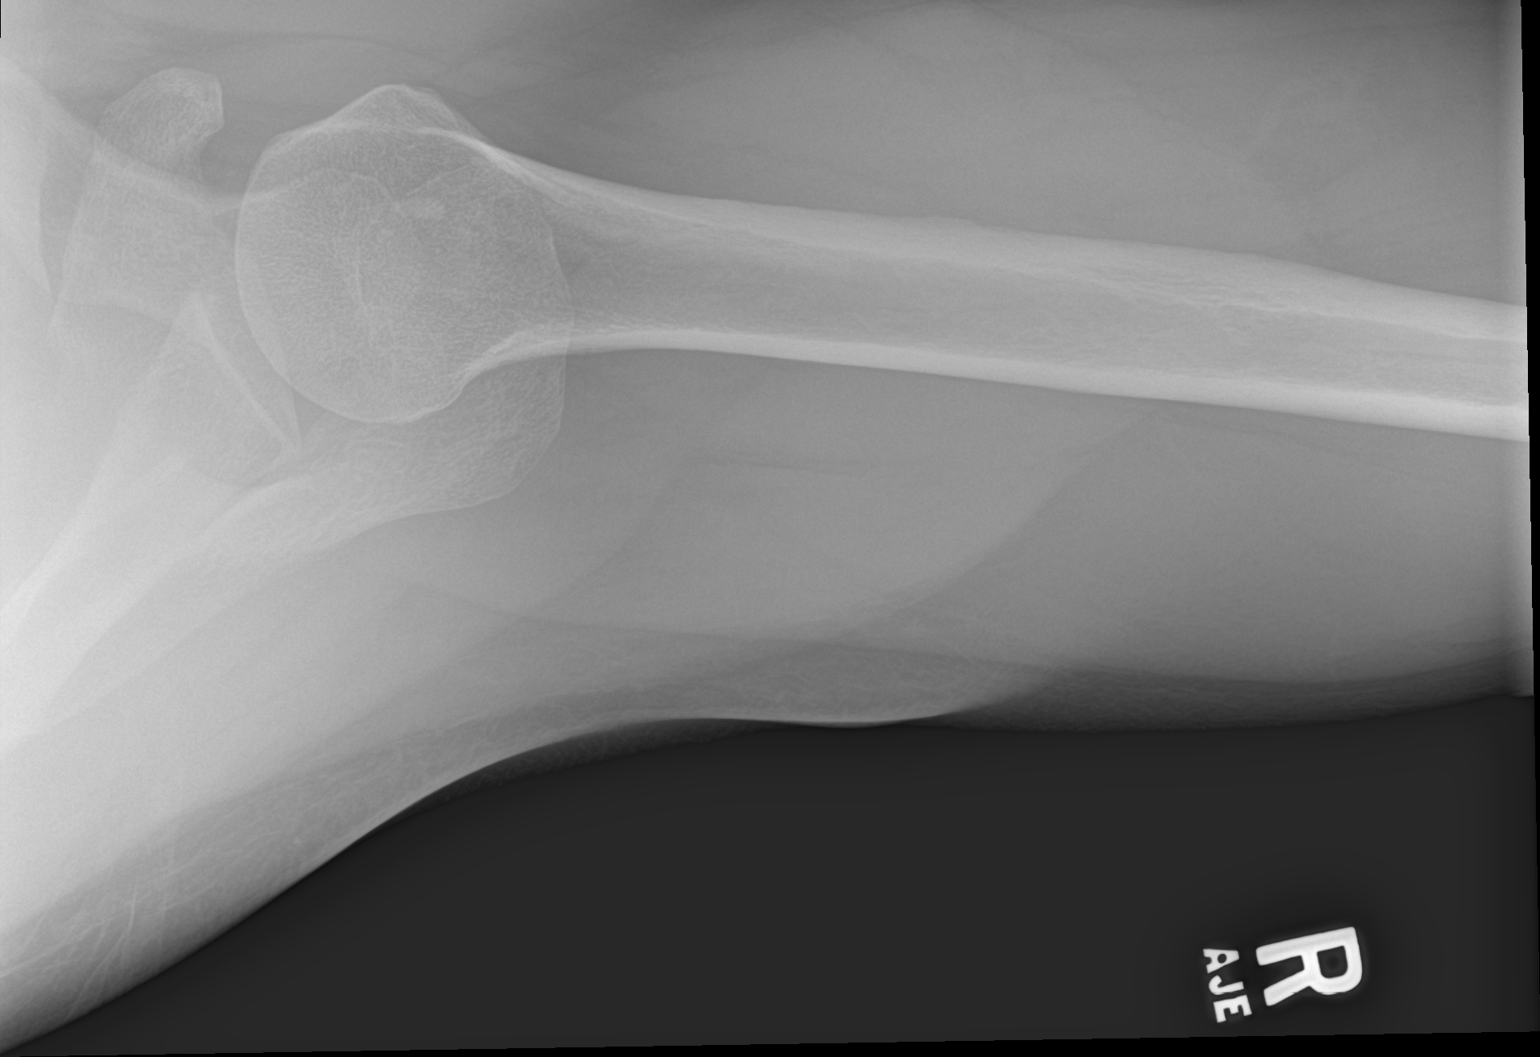

[3 of 3 positions shown; findings below may reference images not displayed]

FINDINGS: There is no evidence of fracture or dislocation. There is no
evidence of arthropathy or other focal bone abnormality. Soft
tissues are unremarkable.
IMPRESSION: Negative.

## 2016-10-04 ENCOUNTER — Ambulatory Visit
Admission: EM | Admit: 2016-10-04 | Discharge: 2016-10-04 | Disposition: A | Discharge status: Home / Self Care | Payer: BLUE CROSS/BLUE SHIELD | Attending: Family Medicine | Admitting: Family Medicine

## 2016-10-04 DIAGNOSIS — R112 Nausea with vomiting, unspecified: Secondary | ICD-10-CM

## 2016-10-04 DIAGNOSIS — R197 Diarrhea, unspecified: Secondary | ICD-10-CM | POA: Diagnosis not present

## 2016-10-04 MED ORDER — ONDANSETRON 4 MG PO TBDP: 4.0000 mg | ORAL_TABLET | Freq: Three times a day (TID) | ORAL | 0 refills | Status: DC | PRN

## 2016-10-04 MED ORDER — ONDANSETRON 8 MG PO TBDP
8.0000 mg | ORAL_TABLET | Freq: Once | ORAL | Status: AC
  Administered 2016-10-04: 8 mg via ORAL

## 2016-10-04 NOTE — Discharge Instructions (Signed)
Take medication as prescribed. Rest. Drink plenty of fluids.  ° °Follow up with your primary care physician this week as needed. Return to Urgent care for new or worsening concerns.  ° °

## 2016-10-04 NOTE — ED Triage Notes (Signed)
Patient complains of abdominal pain, nausea, diarrhea, vomiting. Patient states that he has been improving but has missed work the last two days. Patient states that he believes that he ate something bad and started having issues Sunday night/ Monday morning.

## 2016-10-04 NOTE — ED Provider Notes (Signed)
[facility] ____________________________________________  Time seen: Approximately 11:14 AM  I have reviewed the triage vital signs and the nursing notes.   HISTORY  Chief Complaint Abdominal Pain  HPI Hamad Whyte is a 41 y.o. male presenting for the complaints of nausea, vomiting diarrhea. Patient reports symptoms started late Sunday night into Monday morning. Patient states yesterday he had approximately 3 episodes of vomiting as well as diarrhea. Denies abdominal color vomiting or diarrhea, melena, hemoptysis, hematochezia. Patient reports has been tolerating some fluids well today. Reports his wife also has similar symptoms, and they suspect it is something that the eighth at home Sunday night. Denies fevers. States some intermittent abdominal cramping discomfort, denies abdominal pain. Denies urinary changes. Denies recent sickness. Denies other known sick contacts. Denies current abdominal pain.   Denies chest pain, shortness of breath, abdominal pain, dysuria, extremity pain, extremity swelling or rash. Denies recent sickness. Denies recent antibiotic use.   Patient reports he missed work yesterday and today, and needs a note stating he was seen.    History reviewed. No pertinent past medical history.  There are no active problems to display for this patient.   Past Surgical History:  Procedure Laterality Date  . SHOULDER SURGERY Right      No current facility-administered medications for this encounter.   Current Outpatient Prescriptions:  .  albuterol (PROVENTIL HFA;VENTOLIN HFA) 108 (90 Base) MCG/ACT inhaler, Inhale 2 puffs into the lungs every 6 (six) hours as needed for wheezing or shortness of breath., Disp: 1 Inhaler, Rfl: 0 .  cyclobenzaprine (FLEXERIL) 5 MG tablet, Take 1 tablet (5 mg total) by mouth 3 (three) times daily as needed for muscle spasms., Disp: 14 tablet, Rfl: 0 .  ondansetron (ZOFRAN ODT) 4 MG disintegrating tablet, Take 1 tablet (4  mg total) by mouth every 8 (eight) hours as needed., Disp: 15 tablet, Rfl: 0 .  penicillin v potassium (VEETID) 500 MG tablet, Take 1 tablet (500 mg total) by mouth 4 (four) times daily., Disp: 40 tablet, Rfl: 0 .  traMADol (ULTRAM) 50 MG tablet, Take 1 tablet (50 mg total) by mouth every 6 (six) hours as needed., Disp: 20 tablet, Rfl: 0  Allergies Ibuprofen and Nsaids  History reviewed. No pertinent family history.  Social History Social History  Substance Use Topics  . Smoking status: Current Every Day Smoker    Packs/day: 1.00  . Smokeless tobacco: Never Used  . Alcohol use Yes    Review of Systems Constitutional: No fever/chills Eyes: No visual changes. ENT: No sore throat. Cardiovascular: Denies chest pain. Respiratory: Denies shortness of breath. Gastrointestinal: as above.  Genitourinary: Negative for dysuria. Musculoskeletal: Negative for back pain. Skin: Negative for rash. Neurological: Negative for headaches, focal weakness or numbness.  10-point ROS otherwise negative.  ____________________________________________   PHYSICAL EXAM:  VITAL SIGNS: ED Triage Vitals  Enc Vitals Group     BP 10/04/16 1110 (!) 138/104     Pulse Rate 10/04/16 1110 89     Resp 10/04/16 1110 18     Temp 10/04/16 1110 98.7 F (37.1 C)     Temp Source 10/04/16 1110 Oral     SpO2 10/04/16 1110 99 %     Weight 10/04/16 1109 245 lb (111.1 kg)     Height 10/04/16 1109 6' (1.829 m)     Head Circumference --      Peak Flow --      Pain Score 10/04/16 1110 3     Pain Loc --  Pain Edu? --      Excl. in GC? --    Constitutional: Alert and oriented. Well appearing and in no acute distress. Eyes: Conjunctivae are normal. PERRL. EOMI. ENT      Head: Normocephalic and atraumatic.      Nose: No congestion/rhinnorhea.      Mouth/Throat: Mucous membranes are moist.Oropharynx non-erythematous. Hematological/Lymphatic/Immunilogical: No cervical lymphadenopathy. Cardiovascular: Normal  rate, regular rhythm. Grossly normal heart sounds.  Good peripheral circulation. Respiratory: Normal respiratory effort without tachypnea nor retractions. Breath sounds are clear and equal bilaterally. No wheezes, rales, rhonchi. Gastrointestinal: Soft and nontender. No distention. Normal Bowel sounds. No CVA tenderness. Musculoskeletal:  Steady gait. No midline cervical, thoracic or lumbar tenderness to palpation.  Neurologic:  Normal speech and language. Speech is normal. No gait instability.  Skin:  Skin is warm, dry and intact. No rash noted. Psychiatric: Mood and affect are normal. Speech and behavior are normal. Patient exhibits appropriate insight and judgment   ___________________________________________   LABS (all labs ordered are listed, but only abnormal results are displayed)  Labs Reviewed - No data to display  RADIOLOGY  No results found. ____________________________________________   PROCEDURES Procedures     INITIAL IMPRESSION / ASSESSMENT AND PLAN / ED COURSE  Pertinent labs & imaging results that were available during my care of the patient were reviewed by me and considered in my medical decision making (see chart for details).  Well appearing patient, no acute distress. After zofran in urgent care, tolerating fluids well. When necessary Zofran at home as needed. Encourage rest, fluids and BRAT diet. Work note given for today and tomorrow.Discussed indication, risks and benefits of medications with patient.  Discussed follow up with Primary care physician this week. Discussed follow up and return parameters including no resolution or any worsening concerns. Patient verbalized understanding and agreed to plan.   ____________________________________________   FINAL CLINICAL IMPRESSION(S) / ED DIAGNOSES  Final diagnoses:  Nausea vomiting and diarrhea     Discharge Medication List as of 10/04/2016 11:38 AM    START taking these medications   Details    ondansetron (ZOFRAN ODT) 4 MG disintegrating tablet Take 1 tablet (4 mg total) by mouth every 8 (eight) hours as needed., Starting Tue 10/04/2016, Normal        Note: This dictation was prepared with Dragon dictation along with smaller phrase technology. Any transcriptional errors that result from this process are unintentional.         Renford DillsLindsey Kishon Garriga, NP 10/04/16 1300

## 2017-10-14 ENCOUNTER — Other Ambulatory Visit: Payer: Self-pay

## 2017-10-14 ENCOUNTER — Emergency Department
Admission: EM | Admit: 2017-10-14 | Discharge: 2017-10-14 | Disposition: A | Discharge status: Home / Self Care | Payer: BLUE CROSS/BLUE SHIELD | Attending: Emergency Medicine | Admitting: Emergency Medicine

## 2017-10-14 ENCOUNTER — Encounter: Payer: Self-pay | Admitting: Emergency Medicine

## 2017-10-14 DIAGNOSIS — S61412A Laceration without foreign body of left hand, initial encounter: Secondary | ICD-10-CM | POA: Insufficient documentation

## 2017-10-14 DIAGNOSIS — W260XXA Contact with knife, initial encounter: Secondary | ICD-10-CM | POA: Insufficient documentation

## 2017-10-14 DIAGNOSIS — Y999 Unspecified external cause status: Secondary | ICD-10-CM | POA: Insufficient documentation

## 2017-10-14 DIAGNOSIS — Z79899 Other long term (current) drug therapy: Secondary | ICD-10-CM | POA: Insufficient documentation

## 2017-10-14 DIAGNOSIS — Y9289 Other specified places as the place of occurrence of the external cause: Secondary | ICD-10-CM | POA: Insufficient documentation

## 2017-10-14 DIAGNOSIS — Y9389 Activity, other specified: Secondary | ICD-10-CM | POA: Insufficient documentation

## 2017-10-14 DIAGNOSIS — F17210 Nicotine dependence, cigarettes, uncomplicated: Secondary | ICD-10-CM | POA: Insufficient documentation

## 2017-10-14 DIAGNOSIS — Z23 Encounter for immunization: Secondary | ICD-10-CM | POA: Insufficient documentation

## 2017-10-14 MED ORDER — TETANUS-DIPHTH-ACELL PERTUSSIS 5-2.5-18.5 LF-MCG/0.5 IM SUSP
0.5000 mL | Freq: Once | INTRAMUSCULAR | Status: AC
  Administered 2017-10-14: 0.5 mL via INTRAMUSCULAR
  Filled 2017-10-14: qty 0.5

## 2017-10-14 MED ORDER — LIDOCAINE HCL (PF) 1 % IJ SOLN
5.0000 mL | Freq: Once | INTRAMUSCULAR | Status: AC
  Administered 2017-10-14: 5 mL
  Filled 2017-10-14: qty 5

## 2017-10-14 NOTE — ED Triage Notes (Signed)
States about 4pm cut L hand with knife, bleeding controlled now.

## 2017-10-14 NOTE — Discharge Instructions (Addendum)
Keep the wound clean, dry, and covered. Wash only with soap & water. [facility] or a [facility] urgent care, or this ED for suture removal in 10-12 days.

## 2017-10-14 NOTE — ED Provider Notes (Signed)
[facility] Emergency Department Provider Note ____________________________________________  Time seen: 62  I have reviewed the triage vital signs and the nursing notes.  HISTORY  Chief Complaint  Laceration  HPI James Tapia is a 42 y.o. male presents to the ED for management of an accidental laceration to the left palm. He describes using a machete to carve some wooden walking sticks. After the machete got stuck, he pulled it, causing a slice to the lateral palm. He presents here after getting the bleeding to stop by applying pressure. He notes his tetanus is at least five years old.   History reviewed. No pertinent past medical history.  There are no active problems to display for this patient.   Past Surgical History:  Procedure Laterality Date  . SHOULDER SURGERY Right     Prior to Admission medications   Medication Sig Start Date End Date Taking? Authorizing Provider  albuterol (PROVENTIL HFA;VENTOLIN HFA) 108 (90 Base) MCG/ACT inhaler Inhale 2 puffs into the lungs every 6 (six) hours as needed for wheezing or shortness of breath. 05/04/16   Schaevitz, Myra Rude, MD  cyclobenzaprine (FLEXERIL) 5 MG tablet Take 1 tablet (5 mg total) by mouth 3 (three) times daily as needed for muscle spasms. 04/20/15   Betancourt, Jarold Song, NP  ondansetron (ZOFRAN ODT) 4 MG disintegrating tablet Take 1 tablet (4 mg total) by mouth every 8 (eight) hours as needed. 10/04/16   Renford Dills, NP  penicillin v potassium (VEETID) 500 MG tablet Take 1 tablet (500 mg total) by mouth 4 (four) times daily. 08/09/15   Evon Slack, PA-C  traMADol (ULTRAM) 50 MG tablet Take 1 tablet (50 mg total) by mouth every 6 (six) hours as needed. 08/09/15   Evon Slack, PA-C    Allergies Ibuprofen and Nsaids  No family history on file.  Social History Social History   Tobacco Use  . Smoking status: Current Every Day Smoker    Packs/day: 1.00  . Smokeless tobacco: Never  Used  Substance Use Topics  . Alcohol use: Yes  . Drug use: No    Review of Systems  Constitutional: Negative for fever. Cardiovascular: Negative for chest pain. Respiratory: Negative for shortness of breath. Musculoskeletal: Negative for back pain. Skin: Negative for rash.  Laceration as above. Neurological: Negative for headaches, focal weakness or numbness. ____________________________________________  PHYSICAL EXAM:  VITAL SIGNS: ED Triage Vitals  Enc Vitals Group     BP 10/14/17 1701 (!) 158/94     Pulse Rate 10/14/17 1701 97     Resp 10/14/17 1701 20     Temp 10/14/17 1701 98.2 F (36.8 C)     Temp Source 10/14/17 1701 Oral     SpO2 10/14/17 1701 95 %     Weight 10/14/17 1702 260 lb (117.9 kg)     Height 10/14/17 1702 6\' 1"  (1.854 m)     Head Circumference --      Peak Flow --      Pain Score 10/14/17 1702 5     Pain Loc --      Pain Edu? --      Excl. in GC? --     Constitutional: Alert and oriented. Well appearing and in no distress. Head: Normocephalic and atraumatic. Cardiovascular: Normal rate, regular rhythm. Normal distal pulses. Respiratory: Normal respiratory effort.  Musculoskeletal: Normal composite fist on the left hand.  She is noted to have a superficial laceration to the dorsal lateral hand at the fifth MCP.  No active bleeding is appreciated.  Nontender with normal range of motion in all extremities.  Neurologic:  Normal gross sensation. Normal intrinsic and opposition testing.  No gross focal neurologic deficits are appreciated. Skin:  Skin is warm, dry and intact. No rash noted. ____________________________________________  PROCEDURES  Tdap 0.5 ml IM  .Marland Kitchen.Laceration Repair Date/Time: 10/14/2017 7:40 PM Performed by: Lissa HoardMenshew, Lynessa Almanzar V Bacon, PA-C Authorized by: Lissa HoardMenshew, Kemarion Abbey V Bacon, PA-C   Consent:    Consent obtained:  Verbal   Consent given by:  Patient   Risks discussed:  Poor wound healing Anesthesia (see MAR for exact dosages):     Anesthesia method:  Local infiltration   Local anesthetic:  Lidocaine 1% w/o epi Laceration details:    Location:  Hand   Hand location:  L palm   Length (cm):  3 Repair type:    Repair type:  Simple Pre-procedure details:    Preparation:  Patient was prepped and draped in usual sterile fashion Treatment:    Area cleansed with:  Betadine   Amount of cleaning:  Standard Skin repair:    Repair method:  Sutures   Suture size:  4-0   Suture material:  Nylon   Suture technique:  Simple interrupted   Number of sutures:  4 Approximation:    Approximation:  Close Post-procedure details:    Dressing:  Non-adherent dressing   Patient tolerance of procedure:  Tolerated well, no immediate complications  ____________________________________________  INITIAL IMPRESSION / ASSESSMENT AND PLAN / ED COURSE  Patient presents to the ED for a self-inflicted accidental laceration to left palm.  Patient is prepped and draped in normal fashion.  His exam is benign prior to the procedure.  His superficial wound is closed using nylon sutures and covered with a nonadherent disposable dressing.  He will follow-up with 1 of the local urgent cares or return to the ED for suture removal in 10-12 days. ____________________________________________  FINAL CLINICAL IMPRESSION(S) / ED DIAGNOSES  Final diagnoses:  Laceration of left hand without foreign body, initial encounter      Lissa HoardMenshew, Judiann Celia V Bacon, PA-C 10/14/17 1946    Sharyn CreamerQuale, Mark, MD 10/15/17 [id number]

## 2018-01-18 ENCOUNTER — Emergency Department
Admission: EM | Admit: 2018-01-18 | Discharge: 2018-01-18 | Disposition: A | Discharge status: Home / Self Care | Payer: Self-pay | Attending: Emergency Medicine | Admitting: Emergency Medicine

## 2018-01-18 DIAGNOSIS — Y929 Unspecified place or not applicable: Secondary | ICD-10-CM | POA: Insufficient documentation

## 2018-01-18 DIAGNOSIS — Y9389 Activity, other specified: Secondary | ICD-10-CM | POA: Insufficient documentation

## 2018-01-18 DIAGNOSIS — Y999 Unspecified external cause status: Secondary | ICD-10-CM | POA: Insufficient documentation

## 2018-01-18 DIAGNOSIS — X500XXA Overexertion from strenuous movement or load, initial encounter: Secondary | ICD-10-CM | POA: Insufficient documentation

## 2018-01-18 DIAGNOSIS — K047 Periapical abscess without sinus: Secondary | ICD-10-CM | POA: Insufficient documentation

## 2018-01-18 DIAGNOSIS — F17210 Nicotine dependence, cigarettes, uncomplicated: Secondary | ICD-10-CM | POA: Insufficient documentation

## 2018-01-18 DIAGNOSIS — Z79899 Other long term (current) drug therapy: Secondary | ICD-10-CM | POA: Insufficient documentation

## 2018-01-18 DIAGNOSIS — S39012A Strain of muscle, fascia and tendon of lower back, initial encounter: Secondary | ICD-10-CM | POA: Insufficient documentation

## 2018-01-18 MED ORDER — AMOXICILLIN 500 MG PO TABS: 500.0000 mg | ORAL_TABLET | Freq: Three times a day (TID) | ORAL | 0 refills | Status: DC

## 2018-01-18 MED ORDER — CYCLOBENZAPRINE HCL 10 MG PO TABS: 10.0000 mg | ORAL_TABLET | Freq: Three times a day (TID) | ORAL | 0 refills | Status: DC | PRN

## 2018-01-18 MED ORDER — LIDOCAINE HCL (PF) 1 % IJ SOLN
1.2000 mL | Freq: Once | INTRAMUSCULAR | Status: AC
Start: 2018-01-18 — End: 2018-01-18
  Administered 2018-01-18: 1.2 mL
  Filled 2018-01-18: qty 5

## 2018-01-18 MED ORDER — TRAMADOL HCL 50 MG PO TABS
50.0000 mg | ORAL_TABLET | Freq: Once | ORAL | Status: AC
  Administered 2018-01-18: 50 mg via ORAL
  Filled 2018-01-18: qty 1

## 2018-01-18 MED ORDER — CEFTRIAXONE SODIUM 1 G IJ SOLR
1.0000 g | Freq: Once | INTRAMUSCULAR | Status: AC
  Administered 2018-01-18: 1 g via INTRAMUSCULAR
  Filled 2018-01-18: qty 10

## 2018-01-18 MED ORDER — TRAMADOL HCL 50 MG PO TABS: 50.0000 mg | ORAL_TABLET | Freq: Four times a day (QID) | ORAL | 0 refills | Status: DC | PRN

## 2018-01-18 NOTE — ED Triage Notes (Signed)
Patient c/o right lower dental pain. Patient believes he may have an infected tooth.

## 2018-01-18 NOTE — ED Provider Notes (Signed)
[facility] Emergency Department Provider Note ____________________________________________  Time seen: Approximately 7:41 PM  I have reviewed the triage vital signs and the nursing notes.   HISTORY  Chief Complaint Dental Pain   HPI James Tapia is a 42 y.o. male who presents to the emergency department for treatment and evaluation of dental pain and low back pain.  Dental pain has been intermittent over years but over the past 24 hours he has noticed an increase in pain and a area of swelling on the right lower jaw. He states that his back pain is also acute on chronic.  He has been helping someone move carried to refrigerators yesterday which has aggravated his chronic pain.  No alleviating measures have been attempted for either of these complaints prior to arrival.   History reviewed. No pertinent past medical history.  There are no active problems to display for this patient.   Past Surgical History:  Procedure Laterality Date  . SHOULDER SURGERY Right     Prior to Admission medications   Medication Sig Start Date End Date Taking? Authorizing Provider  albuterol (PROVENTIL HFA;VENTOLIN HFA) 108 (90 Base) MCG/ACT inhaler Inhale 2 puffs into the lungs every 6 (six) hours as needed for wheezing or shortness of breath. 05/04/16   Schaevitz, Myra Rudeavid Matthew, MD  amoxicillin (AMOXIL) 500 MG tablet Take 1 tablet (500 mg total) by mouth 3 (three) times daily. 01/18/18   Citlalic Norlander, Rulon Eisenmengerari B, FNP  cyclobenzaprine (FLEXERIL) 10 MG tablet Take 1 tablet (10 mg total) by mouth 3 (three) times daily as needed for muscle spasms. 01/18/18   Britain Saber B, FNP  ondansetron (ZOFRAN ODT) 4 MG disintegrating tablet Take 1 tablet (4 mg total) by mouth every 8 (eight) hours as needed. 10/04/16   Renford DillsMiller, Lindsey, NP  traMADol (ULTRAM) 50 MG tablet Take 1 tablet (50 mg total) by mouth every 6 (six) hours as needed. 01/18/18   Lydiana Milley, Rulon Eisenmengerari B, FNP    Allergies Ibuprofen and  Nsaids  No family history on file.  Social History Social History   Tobacco Use  . Smoking status: Current Every Day Smoker    Packs/day: 1.00  . Smokeless tobacco: Never Used  Substance Use Topics  . Alcohol use: Yes  . Drug use: No    Review of Systems Constitutional: Negative for fever or recent illness. ENT: Positive for dental pain. Musculoskeletal: Negative for trismus of the jaw. Positive for diffuse low back pain. Skin: Negative for wound or lesion. ____________________________________________   PHYSICAL EXAM:  VITAL SIGNS: ED Triage Vitals  Enc Vitals Group     BP 01/18/18 1924 (!) 164/110     Pulse Rate 01/18/18 1924 (!) 102     Resp 01/18/18 1924 18     Temp 01/18/18 1924 98.4 F (36.9 C)     Temp Source 01/18/18 1924 Oral     SpO2 01/18/18 1924 99 %     Weight 01/18/18 1925 260 lb (117.9 kg)     Height --      Head Circumference --      Peak Flow --      Pain Score 01/18/18 1925 8     Pain Loc --      Pain Edu? --      Excl. in GC? --     Constitutional: Alert and oriented. Well appearing and in no acute distress. Eyes: Conjunctiva are clear without discharge or drainage. Mouth/Throat: Airway is patent. Periodontal Exam    Hematological/Lymphatic/Immunilogical: No palpable  adenopathy on exam Respiratory: Respirations even and unlabored. Musculoskeletal: Full ROM of the jaw.  Patient observed ambulating with assistance of his cane. Antalgic but steady gait. Neurologic: Awake, alert, oriented.  Skin: No facial edema appreciated, however he does have a beard and it is difficult to determine if there may be a very small amount of edema. Psychiatric: Affect and behavior intact.  ____________________________________________   LABS (all labs ordered are listed, but only abnormal results are displayed)  Labs Reviewed - No data to display ____________________________________________   RADIOLOGY  Not  indicated. ____________________________________________   PROCEDURES  Procedure(s) performed:   Procedures  Critical Care performed: No ____________________________________________   INITIAL IMPRESSION / ASSESSMENT AND PLAN / ED COURSE  James Tapia is a 42 y.o. male who presents to the emergency department for treatment and evaluation of dental pain and low back pain.  He will be treated with amoxicillin, tramadol, and Flexeril.  He was given several options for dental follow-up.  He is to establish a primary care and follow-up with him for his acute on chronic back pain if not improving with rest, heat or ice, and the muscle relaxers and pain medication.  He was advised that he should return to the emergency department for symptoms of change or worsen if he is unable to schedule an appointment.  Pertinent labs & imaging results that were available during my care of the patient were reviewed by me and considered in my medical decision making (see chart for details).  ____________________________________________   FINAL CLINICAL IMPRESSION(S) / ED DIAGNOSES  Final diagnoses:  Dental abscess  Strain of lumbar region, initial encounter    Discharge Medication List as of 01/18/2018  8:01 PM    START taking these medications   Details  amoxicillin (AMOXIL) 500 MG tablet Take 1 tablet (500 mg total) by mouth 3 (three) times daily., Starting Thu 01/18/2018, Print        If controlled substance prescribed during this visit, 12 month history viewed on the [facility] prior to issuing an initial prescription for Schedule II or III opiod.  Note:  This document was prepared using Dragon voice recognition software and may include unintentional dictation errors.    Chinita Pester, FNP 01/18/18 2152    Nita Sickle, MD 01/20/18 1540

## 2018-06-27 ENCOUNTER — Emergency Department: Payer: Self-pay

## 2018-06-27 ENCOUNTER — Other Ambulatory Visit: Payer: Self-pay

## 2018-06-27 ENCOUNTER — Emergency Department
Admission: EM | Admit: 2018-06-27 | Discharge: 2018-06-27 | Disposition: A | Discharge status: Home / Self Care | Payer: Self-pay | Attending: Emergency Medicine | Admitting: Emergency Medicine

## 2018-06-27 DIAGNOSIS — R079 Chest pain, unspecified: Secondary | ICD-10-CM | POA: Insufficient documentation

## 2018-06-27 DIAGNOSIS — F419 Anxiety disorder, unspecified: Secondary | ICD-10-CM | POA: Insufficient documentation

## 2018-06-27 DIAGNOSIS — K0889 Other specified disorders of teeth and supporting structures: Secondary | ICD-10-CM | POA: Insufficient documentation

## 2018-06-27 DIAGNOSIS — F17210 Nicotine dependence, cigarettes, uncomplicated: Secondary | ICD-10-CM | POA: Insufficient documentation

## 2018-06-27 HISTORY — DX: Anxiety disorder, unspecified: F41.9

## 2018-06-27 LAB — BASIC METABOLIC PANEL
ANION GAP: 11 (ref 5–15)
BUN: 10 mg/dL (ref 6–20)
CHLORIDE: 102 mmol/L (ref 98–111)
CO2: 26 mmol/L (ref 22–32)
Calcium: 9.2 mg/dL (ref 8.9–10.3)
Creatinine, Ser: 1.07 mg/dL (ref 0.61–1.24)
GFR calc non Af Amer: >60 mL/min (ref >60)
Glucose, Bld: 116 mg/dL — ABNORMAL HIGH (ref 70–99)
POTASSIUM: 3.5 mmol/L (ref 3.5–5.1)
SODIUM: 139 mmol/L (ref 135–145)

## 2018-06-27 LAB — CBC
HEMATOCRIT: 50.3 % (ref 39.0–52.0)
HEMOGLOBIN: 17.1 g/dL — AB (ref 13.0–17.0)
MCH: 29.2 pg (ref 26.0–34.0)
MCHC: 34 g/dL (ref 30.0–36.0)
MCV: 85.8 fL (ref 80.0–100.0)
NRBC: 0 % (ref 0.0–0.2)
Platelets: 208 10*3/uL (ref 150–400)
RBC: 5.86 MIL/uL — AB (ref 4.22–5.81)
RDW: 13.2 % (ref 11.5–15.5)
WBC: 12.5 10*3/uL — AB (ref 4.0–10.5)

## 2018-06-27 LAB — TROPONIN I: Troponin I: <0.03 ng/mL (ref <0.03)

## 2018-06-27 MED ORDER — CLINDAMYCIN HCL 300 MG PO CAPS: 300.0000 mg | ORAL_CAPSULE | Freq: Three times a day (TID) | ORAL | 0 refills | Status: AC

## 2018-06-27 NOTE — ED Notes (Signed)
Pt ambulatory to POV without difficulty. VSS. NAD. Discharge instructions, RX and follow up reviewed. All questions and concerns addressed.  

## 2018-06-27 NOTE — ED Provider Notes (Signed)
[facility] Emergency Department Provider Note  Time seen: 3:57 PM  I have reviewed the triage vital signs and the nursing notes.   HISTORY  Chief Complaint Tachycardia; Anxiety; and Chest Pain    HPI James Tapia is a 42 y.o. male with a past medical history of anxiety presents to the emergency department for rapid heart rate and dental pain.  According to the patient he has been struggling with anxiety for many years.  States today he had a "panic attack."  He describes a tightness sensation in his chest with rapid breathing and feeling like his heart was pounding developed tingling in his hands and into his left neck.  States this is typical of his panic attacks.  States upon arrival to the emergency department his symptoms have resolved.  As a secondary complaint he states for the last several weeks he has been dealing with dental pain, had a leftover antibiotic prescription so he took the last couple days of antibiotics, but states continues to have dental pain in the right upper lateral incisor.   Past Medical History:  Diagnosis Date  . Anxiety     There are no active problems to display for this patient.   Past Surgical History:  Procedure Laterality Date  . SHOULDER SURGERY Right     Prior to Admission medications   Medication Sig Start Date End Date Taking? Authorizing Provider  albuterol (PROVENTIL HFA;VENTOLIN HFA) 108 (90 Base) MCG/ACT inhaler Inhale 2 puffs into the lungs every 6 (six) hours as needed for wheezing or shortness of breath. 05/04/16   Schaevitz, Myra Rude, MD  amoxicillin (AMOXIL) 500 MG tablet Take 1 tablet (500 mg total) by mouth 3 (three) times daily. 01/18/18   Triplett, Rulon Eisenmenger B, FNP  cyclobenzaprine (FLEXERIL) 10 MG tablet Take 1 tablet (10 mg total) by mouth 3 (three) times daily as needed for muscle spasms. 01/18/18   Triplett, Cari B, FNP  ondansetron (ZOFRAN ODT) 4 MG disintegrating tablet Take 1 tablet (4 mg total) by  mouth every 8 (eight) hours as needed. 10/04/16   Renford Dills, NP  traMADol (ULTRAM) 50 MG tablet Take 1 tablet (50 mg total) by mouth every 6 (six) hours as needed. 01/18/18   Chinita Pester, FNP    Allergies  Allergen Reactions  . Ibuprofen   . Nsaids Hives    No family history on file.  Social History Social History   Tobacco Use  . Smoking status: Current Every Day Smoker    Packs/day: 1.00  . Smokeless tobacco: Never Used  Substance Use Topics  . Alcohol use: Yes  . Drug use: No    Review of Systems Constitutional: Negative for fever. ENT: Positive for dental pain Cardiovascular: Positive for chest tightness and rapid heart rate, now resolved Respiratory: Felt like he was breathing fast like, now resolved Gastrointestinal: Negative for abdominal pain, vomiting and diarrhea. Genitourinary: Negative for urinary compaints Musculoskeletal: Negative for musculoskeletal complaints Skin: Negative for skin complaints  Neurological: Negative for headache All other ROS negative  ____________________________________________   PHYSICAL EXAM:  VITAL SIGNS: ED Triage Vitals  Enc Vitals Group     BP 06/27/18 1216 (!) 166/106     Pulse Rate 06/27/18 1216 (!) 102     Resp 06/27/18 1216 20     Temp 06/27/18 1216 98.2 F (36.8 C)     Temp Source 06/27/18 1216 Oral     SpO2 06/27/18 1216 100 %     Weight 06/27/18 1217  260 lb (117.9 kg)     Height 06/27/18 1217 6\' 1"  (1.854 m)     Head Circumference --      Peak Flow --      Pain Score 06/27/18 1222 0     Pain Loc --      Pain Edu? --      Excl. in GC? --     Constitutional: Alert and oriented. Well appearing and in no distress. Eyes: Normal exam ENT   Head: Normocephalic and atraumatic.   Mouth/Throat: Mucous membranes are moist. Cardiovascular: Normal rate, regular rhythm. No murmur Respiratory: Normal respiratory effort without tachypnea nor retractions. Breath sounds are clear Gastrointestinal: Soft  and nontender. No distention.  Musculoskeletal: Nontender with normal range of motion in all extremities.  Neurologic:  Normal speech and language. No gross focal neurologic deficits Skin:  Skin is warm, dry and intact.  Psychiatric: Mood and affect are normal.   ____________________________________________    EKG  EKG viewed and interpreted by myself shows a normal sinus rhythm at 98 bpm with a narrow QRS, normal axis, normal intervals, nonspecific ST changes.  Largely unchanged from last EKG in 2017.  ____________________________________________    RADIOLOGY  Chest x-ray is negative.  ____________________________________________   INITIAL IMPRESSION / ASSESSMENT AND PLAN / ED COURSE  Pertinent labs & imaging results that were available during my care of the patient were reviewed by me and considered in my medical decision making (see chart for details).  Patient presents to the emergency department for chest tightness rapid heartbeat, states he has a long history of anxiety and this is typical of his panic attacks.  His symptoms have since resolved and he feels fairly normal at this time.  As a secondary complaint the patient states he does have right upper dental pain which has been ongoing for 1 to 2 weeks.  States he knows he needs to have teeth pulled but he cannot afford to do it at this time.  During my exam there is no sign of abscess.  Patient denies any fever.  Patient's lab work is largely within normal limits including a normal white blood cell count and a negative troponin.  EKG is unchanged and chest x-ray shows no acute findings.  We will discharge with a course of clindamycin, will provide a list of dental clinics for the patient.  I also discussed follow-up with a cardiologist for a stress test as the patient states a family history of cardiac disease. ____________________________________________   FINAL CLINICAL IMPRESSION(S) / ED DIAGNOSES  Anxiety Dental  pain    Minna AntisPaduchowski, Madalynn Pickelsimer, MD 06/27/18 1601

## 2018-06-27 NOTE — ED Triage Notes (Signed)
Pt reports that he has a hx of anxiety and this am he had episode of "rapid heart beat" - he is currently stressed with the care of his mother and other stressors - pt reports that he is normally able to be calmed by significant other but not today - so he questioned cardiac issue - at this time not symptomatic

## 2019-03-14 ENCOUNTER — Other Ambulatory Visit: Payer: Self-pay

## 2019-03-14 ENCOUNTER — Emergency Department
Admission: EM | Admit: 2019-03-14 | Discharge: 2019-03-14 | Disposition: A | Discharge status: Home / Self Care | Payer: Self-pay | Attending: Emergency Medicine | Admitting: Emergency Medicine

## 2019-03-14 ENCOUNTER — Encounter: Payer: Self-pay | Admitting: Emergency Medicine

## 2019-03-14 DIAGNOSIS — F17210 Nicotine dependence, cigarettes, uncomplicated: Secondary | ICD-10-CM | POA: Insufficient documentation

## 2019-03-14 DIAGNOSIS — K029 Dental caries, unspecified: Secondary | ICD-10-CM | POA: Insufficient documentation

## 2019-03-14 DIAGNOSIS — Z886 Allergy status to analgesic agent status: Secondary | ICD-10-CM | POA: Insufficient documentation

## 2019-03-14 MED ORDER — AMOXICILLIN 500 MG PO TABS: 500.0000 mg | ORAL_TABLET | Freq: Three times a day (TID) | ORAL | 0 refills | Status: DC

## 2019-03-14 NOTE — Discharge Instructions (Addendum)
OPTIONS FOR DENTAL FOLLOW UP CARE ° °[place] Department of Health and Human Services - Local Safety Net Dental Clinics °http://www.ncdhhs.gov/dph/oralhealth/services/safetynetclinics.htm °  °[facility] (336-562-3123) ° °[facility] [place] ([phone]) ° °[facility] ([phone] ext [phone]) ° °[facility] ([phone]) ° °[facility] ([phone]) °This clinic caters to the indigent population and is on a lottery system. °Location: °[facility], [address], [place] °Clinic Hours: °Wednesdays from 6pm - 9pm, patients seen by a lottery system. °For dates, call or go to [email or web address].[facility].edu/[facility]/patients/Dental-[facility] °Services: °Cleanings, fillings and simple extractions. °Payment Options: °DENTAL WORK IS FREE OF CHARGE. Bring proof of income or support. °Best way to get seen: °Arrive at 5:15 pm - this is a lottery, NOT first come/first serve, so arriving earlier will not increase your chances of being seen. °  °  °[facility] °[phone] °Select option 1 for emergencies °  °Location: °[facility], [address], [place] °Clinic Hours: °No walk-ins accepted - call the day before to schedule an appointment. °Check in times are 9:30 am and 1:30 pm. °Services: °Simple extractions, temporary fillings, pulpectomy/pulp debridement, uncomplicated abscess drainage. °Payment Options: °PAYMENT IS DUE AT THE TIME OF SERVICE.  Fee is usually $100-200, additional surgical procedures (e.g. abscess drainage) may be extra. °Cash, checks, Visa/MasterCard accepted.  Can file Medicaid if patient is covered for dental - patient should call case worker to check. °No discount for [facility] patients. °Best way to get seen: °MUST call the day before and get onto the schedule. Can usually be seen the next 1-2 days. No walk-ins accepted. °  °  °[facility] °[phone] °   °Location: °[facility], [address], [place] °Clinic Hours: °M, W, Th, F 8am or 1:30pm, Tues 9a or 1:30 - first come/first served. °Services: °Simple extractions, temporary fillings, uncomplicated abscess drainage.  You do not need to be an [place] resident. °Payment Options: °PAYMENT IS DUE AT THE TIME OF SERVICE. °Dental insurance, otherwise sliding scale - bring proof of income or support. °Depending on income and treatment needed, cost is usually $50-200. °Best way to get seen: °Arrive early as it is first come/first served. °  °  °[facility] °[phone] °  °Location: °[address] °Clinic Hours: °Mon-Thu 8a-5p °Services: °Most basic dental services including extractions and fillings. °Payment Options: °PAYMENT IS DUE AT THE TIME OF SERVICE. °Sliding scale, up to 50% off - bring proof if income or support. °Medicaid with dental option accepted. °Best way to get seen: °Call to schedule an appointment, can usually be seen within 2 weeks OR they will try to see walk-ins - show up at 8a or 2p (you may have to wait). °  °  °[facility] °919-245-2435 °[place] RESIDENTS ONLY °  °Location: °[facility], [address], [place], [place] [address] °Clinic Hours: By appointment only. °Monday - Thursday 8am-5pm, Friday 8am-12pm °Services: Cleanings, fillings, extractions. °Payment Options: °PAYMENT IS DUE AT THE TIME OF SERVICE. °Cash, Visa or MasterCard. Sliding scale - $30 minimum per service. °Best way to get seen: °Come in to office, complete packet and make an appointment - need proof of income °or support monies for each household member and proof of [place] residence. °Usually takes about a month to get in. °  °  °[facility] °[phone] °  °Location: °[address]   [place] °Clinic Hours: Walk-in Urgent Care Dental Services are offered Monday-Friday  mornings only. °The numbers of emergencies accepted daily is limited to the number of °providers available. °Maximum 15 - Mondays, Wednesdays & Thursdays °Maximum 10 - Tuesdays & Fridays °Services: °You do not need to be a [place] resident to be seen for a dental emergency. °Emergencies are defined as pain, swelling, abnormal bleeding, or dental trauma. Walkins will receive x-rays if needed. °NOTE: Dental cleaning is not an emergency. °Payment Options: °PAYMENT IS DUE AT THE TIME OF SERVICE. °Minimum co-pay is $40.00 for uninsured patients. °Minimum co-pay is $3.00 for Medicaid with dental coverage. °Dental Insurance is accepted and must be presented at time of visit. °Medicare does not cover dental. °Forms of payment: Cash, credit card, checks. °Best way to get seen: °If not previously registered with the clinic, walk-in dental registration begins at 7:15 am and is on a first come/first serve basis. °If previously registered with the clinic, call to make an appointment. °  °  °[facility] °[phone] °[place] RESIDENTS ONLY °  °Location: °[address], [place], [place] °Clinic Hours: °Mon-Thu 10a-2p °Services: Extractions only! °Payment Options: °FREE (donations accepted) - bring proof of income or support °Best way to get seen: °Call and schedule an appointment OR come at 8am on the 1st Monday of every month (except for holidays) when it is first come/first served. °  °  °[facility] °[phone] °  °Location: °[address], [place] °Clinic Hours: °Friday mornings °Services, Payment Options, Best way to get seen: °Call for info °

## 2019-03-14 NOTE — ED Triage Notes (Signed)
Pt c/o a toothache to right lower jaw for over a year. Pt states he has been here before for the same and just needs an antibiotic.

## 2019-03-14 NOTE — ED Provider Notes (Signed)
[facility] Emergency Department Provider Note  ____________________________________________   First MD Initiated Contact with Patient 03/14/19 1538     (approximate)  I have reviewed the triage vital signs and the nursing notes.   HISTORY  Chief Complaint Dental Pain    HPI James Tapia is a 43 y.o. male presents emergency department complaint of a toothache to the right lower jaw.  States had problems with this tooth for over a year.  He states it has infection he just needs antibiotic.  He denies chest pain, shortness of breath, fever or chills.    Past Medical History:  Diagnosis Date  . Anxiety     There are no active problems to display for this patient.   Past Surgical History:  Procedure Laterality Date  . SHOULDER SURGERY Right     Prior to Admission medications   Medication Sig Start Date End Date Taking? Authorizing Provider  albuterol (PROVENTIL HFA;VENTOLIN HFA) 108 (90 Base) MCG/ACT inhaler Inhale 2 puffs into the lungs every 6 (six) hours as needed for wheezing or shortness of breath. 05/04/16   Schaevitz, Myra Rudeavid Matthew, MD  amoxicillin (AMOXIL) 500 MG tablet Take 1 tablet (500 mg total) by mouth 3 (three) times daily. 03/14/19   Sherrie MustacheFisher, Roselyn BeringSusan W, PA-C  cyclobenzaprine (FLEXERIL) 10 MG tablet Take 1 tablet (10 mg total) by mouth 3 (three) times daily as needed for muscle spasms. 01/18/18   Triplett, Cari B, FNP  ondansetron (ZOFRAN ODT) 4 MG disintegrating tablet Take 1 tablet (4 mg total) by mouth every 8 (eight) hours as needed. 10/04/16   Renford DillsMiller, Lindsey, NP  traMADol (ULTRAM) 50 MG tablet Take 1 tablet (50 mg total) by mouth every 6 (six) hours as needed. 01/18/18   Triplett, Rulon Eisenmengerari B, FNP    Allergies Ibuprofen and Nsaids  No family history on file.  Social History Social History   Tobacco Use  . Smoking status: Current Every Day Smoker    Packs/day: 1.00  . Smokeless tobacco: Never Used  Substance Use Topics  . Alcohol  use: Yes  . Drug use: No    Review of Systems  Constitutional: No fever/chills Eyes: No visual changes. ENT: No sore throat.  Positive dental pain Respiratory: Denies cough Genitourinary: Negative for dysuria. Musculoskeletal: Negative for back pain. Skin: Negative for rash.    ____________________________________________   PHYSICAL EXAM:  VITAL SIGNS: ED Triage Vitals  Enc Vitals Group     BP 03/14/19 1414 (!) 134/101     Pulse Rate 03/14/19 1414 97     Resp 03/14/19 1414 18     Temp 03/14/19 1414 99.9 F (37.7 C)     Temp Source 03/14/19 1414 Oral     SpO2 03/14/19 1414 99 %     Weight 03/14/19 1410 260 lb (117.9 kg)     Height 03/14/19 1410 6\' 1"  (1.854 m)     Head Circumference --      Peak Flow --      Pain Score 03/14/19 1410 5     Pain Loc --      Pain Edu? --      Excl. in [place]? --     Constitutional: Alert and oriented. Well appearing and in no acute distress. Eyes: Conjunctivae are normal.  Head: Atraumatic. Nose: No congestion/rhinnorhea. Mouth/Throat: Mucous membranes are moist.  Positive for severe dental caries noted in all teeth.  The right lower molar is loose with a severe cavity at the base of the tooth.  Small lump is noted beside on the gum. Neck:  supple no lymphadenopathy noted Cardiovascular: Normal rate, regular rhythm. Heart sounds are normal Respiratory: Normal respiratory effort.  No retractions, lungs c t a  GU: deferred Musculoskeletal: FROM all extremities, warm and well perfused Neurologic:  Normal speech and language.  Skin:  Skin is warm, dry and intact. No rash noted. Psychiatric: Mood and affect are normal. Speech and behavior are normal.  ____________________________________________   LABS (all labs ordered are listed, but only abnormal results are displayed)  Labs Reviewed - No data to display ____________________________________________   ____________________________________________  RADIOLOGY     ____________________________________________   PROCEDURES  Procedure(s) performed: No  Procedures    ____________________________________________   INITIAL IMPRESSION / ASSESSMENT AND PLAN / ED COURSE  Pertinent labs & imaging results that were available during my care of the patient were reviewed by me and considered in my medical decision making (see chart for details).   Patient presents emergency department with complaints of right lower jaw pain due to dental caries.   exam shows multiple dental caries.  Very poor dentition.  Main exam is unremarkable  .  Patient was given amoxicillin.  He was given a list of discounted dental clinics and a handbook showing free primary care and dental care.  He is to follow-up with him in the clinics.  Is discharged stable condition.    James Tapia was evaluated in Emergency Department on 03/14/2019 for the symptoms described in the history of present illness. He was evaluated in the context of the global COVID-19 pandemic, which necessitated consideration that the patient might be at risk for infection with the SARS-CoV-2 virus that causes COVID-19. Institutional protocols and algorithms that pertain to the evaluation of patients at risk for COVID-19 are in a state of rapid change based on information released by regulatory bodies including the CDC and federal and state organizations. These policies and algorithms were followed during the patient's care in the ED.   As part of my medical decision making, I reviewed the following data within the [place] notes reviewed and incorporated, Old chart reviewed, Notes from prior ED visits and [place] Controlled Substance Database  ____________________________________________   FINAL CLINICAL IMPRESSION(S) / ED DIAGNOSES  Final diagnoses:  Pain due to dental caries      NEW MEDICATIONS STARTED DURING THIS VISIT:  Current Discharge Medication List       Note:   This document was prepared using Dragon voice recognition software and may include unintentional dictation errors.    Versie Starks, PA-C 03/14/19 1556    Nena Polio, MD 03/14/19 2258

## 2019-08-03 ENCOUNTER — Encounter: Payer: Self-pay | Admitting: Emergency Medicine

## 2019-08-03 ENCOUNTER — Emergency Department: Payer: Self-pay

## 2019-08-03 ENCOUNTER — Emergency Department
Admission: EM | Admit: 2019-08-03 | Discharge: 2019-08-03 | Disposition: A | Discharge status: Home / Self Care | Payer: Self-pay | Attending: Emergency Medicine | Admitting: Emergency Medicine

## 2019-08-03 ENCOUNTER — Other Ambulatory Visit: Payer: Self-pay

## 2019-08-03 DIAGNOSIS — J069 Acute upper respiratory infection, unspecified: Secondary | ICD-10-CM | POA: Insufficient documentation

## 2019-08-03 DIAGNOSIS — Z20822 Contact with and (suspected) exposure to covid-19: Secondary | ICD-10-CM | POA: Insufficient documentation

## 2019-08-03 DIAGNOSIS — F17210 Nicotine dependence, cigarettes, uncomplicated: Secondary | ICD-10-CM | POA: Insufficient documentation

## 2019-08-03 DIAGNOSIS — Z886 Allergy status to analgesic agent status: Secondary | ICD-10-CM | POA: Insufficient documentation

## 2019-08-03 LAB — SARS CORONAVIRUS 2 (TAT 6-24 HRS): SARS Coronavirus 2: NEGATIVE

## 2019-08-03 MED ORDER — HYDROXYZINE HCL 50 MG PO TABS: 50.0000 mg | ORAL_TABLET | Freq: Three times a day (TID) | ORAL | 0 refills | Status: DC | PRN

## 2019-08-03 MED ORDER — BENZONATATE 100 MG PO CAPS
200.0000 mg | ORAL_CAPSULE | Freq: Once | ORAL | Status: AC
  Administered 2019-08-03: 200 mg via ORAL
  Filled 2019-08-03: qty 2

## 2019-08-03 MED ORDER — BENZONATATE 100 MG PO CAPS: 200.0000 mg | ORAL_CAPSULE | Freq: Three times a day (TID) | ORAL | 0 refills | Status: DC | PRN

## 2019-08-03 MED ORDER — HYDROXYZINE HCL 50 MG PO TABS
50.0000 mg | ORAL_TABLET | Freq: Once | ORAL | Status: AC
  Administered 2019-08-03: 14:00:00 50 mg via ORAL
  Filled 2019-08-03: qty 1

## 2019-08-03 MED ORDER — HYDROCOD POLST-CPM POLST ER 10-8 MG/5ML PO SUER
5.0000 mL | Freq: Once | ORAL | Status: AC
  Administered 2019-08-03: 5 mL via ORAL
  Filled 2019-08-03: qty 5

## 2019-08-03 MED ORDER — METHYLPREDNISOLONE 4 MG PO TBPK: ORAL_TABLET | ORAL | 0 refills | Status: DC

## 2019-08-03 NOTE — ED Provider Notes (Addendum)
[facility] Emergency Department Provider Note   ____________________________________________   First MD Initiated Contact with Patient 08/03/19 1305     (approximate)  I have reviewed the triage vital signs and the nursing notes.   HISTORY  Chief Complaint Nasal Congestion, Sore Throat, and Cough    HPI James Tapia is a 44 y.o. male patient presents with nasal congestion, cough, and sore throat.  Patient states coughing spells causing a sore throat.  Patient unsure of fever.  Patient stated no recent travel no contact with COVID-19.  Patient states the whole family is sick with  viral infection.  Patient did cough increased with laying down.  Patient rates his pain discomfort a 4/10.  Patient described pain as "achy".  No palliative measure for complaint.         Past Medical History:  Diagnosis Date  . Anxiety     There are no problems to display for this patient.   Past Surgical History:  Procedure Laterality Date  . SHOULDER SURGERY Right     Prior to Admission medications   Medication Sig Start Date End Date Taking? Authorizing Provider  albuterol (PROVENTIL HFA;VENTOLIN HFA) 108 (90 Base) MCG/ACT inhaler Inhale 2 puffs into the lungs every 6 (six) hours as needed for wheezing or shortness of breath. 05/04/16   Schaevitz, Myra Rude, MD  amoxicillin (AMOXIL) 500 MG tablet Take 1 tablet (500 mg total) by mouth 3 (three) times daily. 03/14/19   Fisher, Roselyn Bering, PA-C  benzonatate (TESSALON PERLES) 100 MG capsule Take 2 capsules (200 mg total) by mouth 3 (three) times daily as needed. 08/03/19 08/02/20  Joni Reining, PA-C  cyclobenzaprine (FLEXERIL) 10 MG tablet Take 1 tablet (10 mg total) by mouth 3 (three) times daily as needed for muscle spasms. 01/18/18   Triplett, Rulon Eisenmenger B, FNP  hydrOXYzine (ATARAX/VISTARIL) 50 MG tablet Take 1 tablet (50 mg total) by mouth 3 (three) times daily as needed for itching. 08/03/19   Joni Reining, PA-C    methylPREDNISolone (MEDROL DOSEPAK) 4 MG TBPK tablet Take Tapered dose as directed 08/03/19   Joni Reining, PA-C  ondansetron (ZOFRAN ODT) 4 MG disintegrating tablet Take 1 tablet (4 mg total) by mouth every 8 (eight) hours as needed. 10/04/16   Renford Dills, NP  traMADol (ULTRAM) 50 MG tablet Take 1 tablet (50 mg total) by mouth every 6 (six) hours as needed. 01/18/18   Triplett, Rulon Eisenmenger B, FNP    Allergies Ibuprofen and Nsaids  History reviewed. No pertinent family history.  Social History Social History   Tobacco Use  . Smoking status: Current Every Day Smoker    Packs/day: 0.50    Types: Cigarettes  . Smokeless tobacco: Never Used  Substance Use Topics  . Alcohol use: Yes  . Drug use: No    Review of Systems  Constitutional: No fever/chills Eyes: No visual changes. ENT: No sore throat. Cardiovascular: Denies chest pain. Respiratory: Denies shortness of breath. Gastrointestinal: No abdominal pain.  No nausea, no vomiting.  No diarrhea.  No constipation. Genitourinary: Negative for dysuria. Musculoskeletal: Negative for back pain. Skin: Negative for rash. Neurological: Negative for headaches, focal weakness or numbness. Psychiatric:  Anxiety. Allergic/Immunilogical: NSAIDs. ____________________________________________   PHYSICAL EXAM:  VITAL SIGNS: ED Triage Vitals  Enc Vitals Group     BP 08/03/19 1240 (!) 149/98     Pulse Rate 08/03/19 1240 (!) 112     Resp 08/03/19 1240 18     Temp 08/03/19 1240  98.7 F (37.1 C)     Temp Source 08/03/19 1240 Oral     SpO2 08/03/19 1240 98 %     Weight 08/03/19 1248 265 lb (120.2 kg)     Height 08/03/19 1248 6\' 1"  (1.854 m)     Head Circumference --      Peak Flow --      Pain Score 08/03/19 1247 4     Pain Loc --      Pain Edu? --      Excl. in [place]? --    Constitutional: Alert and oriented. Well appearing and in no acute distress. Neck: No stridor.   Cardiovascular: Normal rate, regular rhythm. Grossly normal heart  sounds.  Good peripheral circulation. Respiratory: Normal respiratory effort.  No retractions. Lungs CTAB.  Nonproductive cough. Gastrointestinal: Soft and nontender. No distention. No abdominal bruits. No CVA tenderness. Musculoskeletal: No lower extremity tenderness nor edema.  No joint effusions. Neurologic:  Normal speech and language. No gross focal neurologic deficits are appreciated. No gait instability. Skin:  Skin is warm, dry and intact. No rash noted. Psychiatric: Mood and affect are normal. Speech and behavior are normal.  ____________________________________________   LABS (all labs ordered are listed, but only abnormal results are displayed)  Labs Reviewed  SARS CORONAVIRUS 2 (TAT 6-24 HRS)   ____________________________________________  EKG   ____________________________________________  RADIOLOGY  ED MD interpretation:    Official radiology report(s): DG Chest Portable 1 View  Result Date: 08/03/2019 CLINICAL DATA:  Cough and fever EXAM: PORTABLE CHEST 1 VIEW COMPARISON:  June 27, 2018 FINDINGS: Lungs are clear. Heart size and pulmonary vascularity are normal. No adenopathy. No bone lesions. IMPRESSION: No edema or consolidation.  No evident adenopathy. Electronically Signed   By: Lowella Grip III M.D.   On: 08/03/2019 13:35    ____________________________________________   PROCEDURES  Procedure(s) performed (including Critical Care):  Procedures   ____________________________________________   INITIAL IMPRESSION / ASSESSMENT AND PLAN / ED COURSE  As part of my medical decision making, I reviewed the following data within the [place]     Patient presents with 1 week of cough, sore throat, nasal and chest congestion.  Discussed negative chest x-ray findings with patient.  Physical exam is consistent with viral respiratory infection with cough.  Patient given discharge care instruction work note.  Patient advised take  medication as directed.  Patient advised self quarantine pending results of COVID-19 test.    James Tapia was evaluated in Emergency Department on 08/03/2019 for the symptoms described in the history of present illness. He was evaluated in the context of the global COVID-19 pandemic, which necessitated consideration that the patient might be at risk for infection with the SARS-CoV-2 virus that causes COVID-19. Institutional protocols and algorithms that pertain to the evaluation of patients at risk for COVID-19 are in a state of rapid change based on information released by regulatory bodies including the CDC and federal and state organizations. These policies and algorithms were followed during the patient's care in the ED.       ____________________________________________   FINAL CLINICAL IMPRESSION(S) / ED DIAGNOSES  Final diagnoses:  Viral URI with cough     ED Discharge Orders         Ordered    benzonatate (TESSALON PERLES) 100 MG capsule  3 times daily PRN     08/03/19 1413    hydrOXYzine (ATARAX/VISTARIL) 50 MG tablet  3 times daily PRN     08/03/19 1413  methylPREDNISolone (MEDROL DOSEPAK) 4 MG TBPK tablet     08/03/19 1413           Note:  This document was prepared using Dragon voice recognition software and may include unintentional dictation errors.    Joni Reining, PA-C 08/03/19 1418    Joni Reining, PA-C 08/03/19 1426    Concha Se, MD 08/04/19 [phone]

## 2019-08-03 NOTE — ED Triage Notes (Signed)
Pt arrived via pOV with reports of 1 week of cough, sore throat, chest and nasal congestion.  Denies any fever.  Pt states other family members are sick as well with similar sxs.  Denies any + COVID contacts.

## 2019-08-03 NOTE — Discharge Instructions (Signed)
Follow discharge care instruction take medication as directed.  Advised self quarantine pending results of COVID-19 test. 

## 2019-08-03 NOTE — ED Notes (Signed)
Nasal congestion and cough x 1 1/2 weeks. States throat and chest getting sore from all the coughing.

## 2019-09-25 ENCOUNTER — Other Ambulatory Visit: Payer: Self-pay

## 2019-09-25 ENCOUNTER — Emergency Department
Admission: EM | Admit: 2019-09-25 | Discharge: 2019-09-25 | Disposition: A | Discharge status: Home / Self Care | Payer: Self-pay | Attending: Emergency Medicine | Admitting: Emergency Medicine

## 2019-09-25 DIAGNOSIS — K029 Dental caries, unspecified: Secondary | ICD-10-CM | POA: Insufficient documentation

## 2019-09-25 DIAGNOSIS — F17210 Nicotine dependence, cigarettes, uncomplicated: Secondary | ICD-10-CM | POA: Insufficient documentation

## 2019-09-25 DIAGNOSIS — K0889 Other specified disorders of teeth and supporting structures: Secondary | ICD-10-CM | POA: Insufficient documentation

## 2019-09-25 MED ORDER — AMOXICILLIN 500 MG PO CAPS: 500.0000 mg | ORAL_CAPSULE | Freq: Three times a day (TID) | ORAL | 0 refills | Status: DC

## 2019-09-25 MED ORDER — AMOXICILLIN 500 MG PO CAPS
500.0000 mg | ORAL_CAPSULE | Freq: Once | ORAL | Status: AC
  Administered 2019-09-25: 500 mg via ORAL
  Filled 2019-09-25: qty 1

## 2019-09-25 NOTE — Discharge Instructions (Addendum)
Take the antibiotic as directed. Follow-up with one of the local dental clinics for dental extraction. Use a soft-bristled toothbrush twice daily. Rinse with warm-salty water after every meal. Return as needed.

## 2019-09-25 NOTE — ED Triage Notes (Signed)
Pt c/o right lower tooth ache for the past 2 weeks, states he keeps having issues with that tooth and is unable to pay to see the dentist.

## 2019-09-25 NOTE — ED Notes (Signed)
Pt reports history of dental pain/infection to R lower side of jaw. States has been on antibiotics and finished them months ago. States needs to have the tooth pulled but hasn't yet. Denies fever, sweats/chills, nausea. Has been on softer diet due to discomfort. Pt sitting calmly in bed. States "would NOT like to have another xray". Pt missing teeth. Gums somewhat swollen.

## 2019-09-25 NOTE — ED Provider Notes (Signed)
[facility] Emergency Department Provider Note ____________________________________________  Time seen: 1233  I have reviewed the triage vital signs and the nursing notes.  HISTORY  Chief Complaint  Dental Pain  HPI James Tapia is a 44 y.o. male presents to the ED for evaluation of dental pain. He admits to poor dentition, and has previously seen a local sliding-scale dental provider for extractions. He complains of pain to the right upper 2nd molar. He denies fevers, chills, sweats, or chest pain. He also denies difficulty breathing, swallowing, or controlling oral secretions. He is requesting antibiotics.    Past Medical History:  Diagnosis Date  . Anxiety     There are no problems to display for this patient.   Past Surgical History:  Procedure Laterality Date  . SHOULDER SURGERY Right     Prior to Admission medications   Medication Sig Start Date End Date Taking? Authorizing Provider  albuterol (PROVENTIL HFA;VENTOLIN HFA) 108 (90 Base) MCG/ACT inhaler Inhale 2 puffs into the lungs every 6 (six) hours as needed for wheezing or shortness of breath. 05/04/16   Schaevitz, Randall An, MD  amoxicillin (AMOXIL) 500 MG capsule Take 1 capsule (500 mg total) by mouth 3 (three) times daily. 09/25/19   Marquist Binstock, Dannielle Karvonen, PA-C    Allergies Ibuprofen and Nsaids  History reviewed. No pertinent family history.  Social History Social History   Tobacco Use  . Smoking status: Current Every Day Smoker    Packs/day: 0.50    Types: Cigarettes  . Smokeless tobacco: Never Used  Substance Use Topics  . Alcohol use: Yes  . Drug use: No    Review of Systems  Constitutional: Negative for fever. Eyes: Negative for visual changes. ENT: Negative for sore throat. Dental pain as above. Cardiovascular: Negative for chest pain. Respiratory: Negative for shortness of breath. Gastrointestinal: Negative for abdominal pain, vomiting and diarrhea. Skin:  Negative for rash. Neurological: Negative for headaches, focal weakness or numbness. ____________________________________________  PHYSICAL EXAM:  VITAL SIGNS: ED Triage Vitals  Enc Vitals Group     BP 09/25/19 1208 (!) 144/102     Pulse Rate 09/25/19 1208 (!) 103     Resp 09/25/19 1208 18     Temp 09/25/19 1211 97.8 F (36.6 C)     Temp Source 09/25/19 1208 Oral     SpO2 09/25/19 1208 100 %     Weight 09/25/19 1208 260 lb (117.9 kg)     Height 09/25/19 1208 6' (1.829 m)     Head Circumference --      Peak Flow --      Pain Score 09/25/19 1208 8     Pain Loc --      Pain Edu? --      Excl. in [place]? --     Constitutional: Alert and oriented. Well appearing and in no distress. Head: Normocephalic and atraumatic. Eyes: Conjunctivae are normal. Normal extraocular movements Mouth/Throat: Mucous membranes are moist.  Uvula is midline and tonsils are flat.  Patient with generalized moderate gingivitis and arrested dental caries.  No brawny sublingual edema is noted.  Patient localizes pain to the right upper second molar.  No focal gum swelling, fluctuance, pointing is appreciated.  No spontaneous purulent drainage is appreciated.  Moderate calculus noted at the gingiva of several teeth. Neck: Supple. No thyromegaly. Hematological/Lymphatic/Immunological: No cervical lymphadenopathy. Cardiovascular: Normal rate, regular rhythm. Normal distal pulses. Respiratory: Normal respiratory effort. No wheezes/rales/rhonchi. Musculoskeletal: Nontender with normal range of motion in all extremities.  Neurologic:  Normal gait without ataxia. Normal speech and language. No gross focal neurologic deficits are appreciated. Skin:  Skin is warm, dry and intact. No rash noted. ____________________________________________  PROCEDURES  Amoxicillin 500 mg p.o. Procedures ____________________________________________  INITIAL IMPRESSION / ASSESSMENT AND PLAN / ED COURSE  Patient with ED evaluation of  dental pain secondary to underlying chronic dental caries.  No signs of any acute infectious process at this time.  No focal gum swelling, no sublingual edema noted.  Patient will treated empirically with amoxicillin.  He is again encouraged to follow-up with a local dental provider for definitive management.  Return precautions have been reviewed.  James Tapia was evaluated in Emergency Department on 09/25/2019 for the symptoms described in the history of present illness. He was evaluated in the context of the global COVID-19 pandemic, which necessitated consideration that the patient might be at risk for infection with the SARS-CoV-2 virus that causes COVID-19. Institutional protocols and algorithms that pertain to the evaluation of patients at risk for COVID-19 are in a state of rapid change based on information released by regulatory bodies including the CDC and federal and state organizations. These policies and algorithms were followed during the patient's care in the ED. ____________________________________________  FINAL CLINICAL IMPRESSION(S) / ED DIAGNOSES  Final diagnoses:  Pain due to dental caries      Lissa Hoard, PA-C 09/25/19 1518    Emily Filbert, MD 09/30/19 [phone]

## 2019-11-17 IMAGING — CR DG CHEST 2V
1 series · 2 of 2 positions shown · non-contrast
Comparison: 05/04/2016.

CLINICAL DATA: 42-year-old current smoker with current history of
anxiety, presenting with tachycardia and chest pain earlier today.

EXAM:
CHEST - 2 VIEW

[Series 1: dg chest 2 view · 0.14mm/px · 2 of 2 slices shown]
[im 1/2]
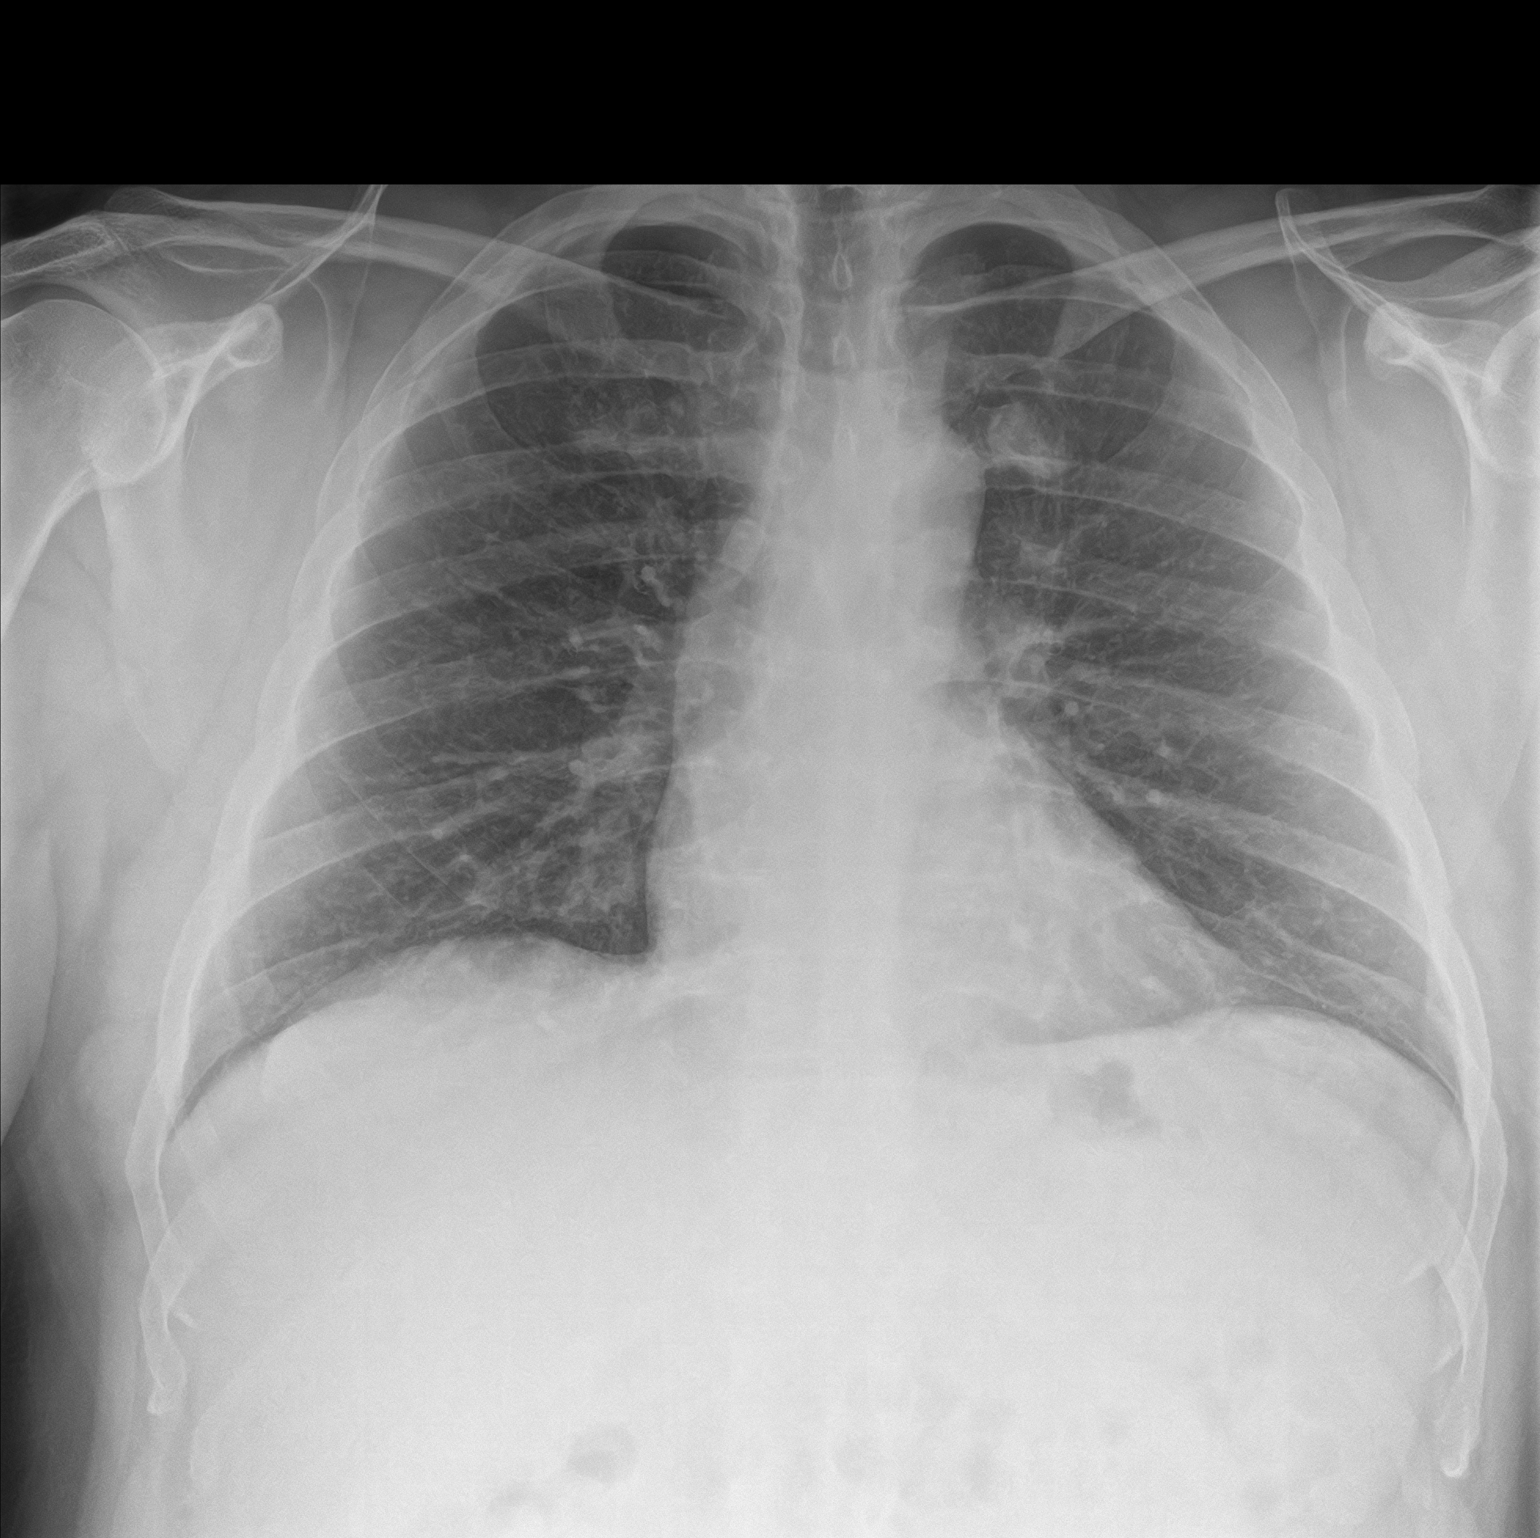
[im 2/2]
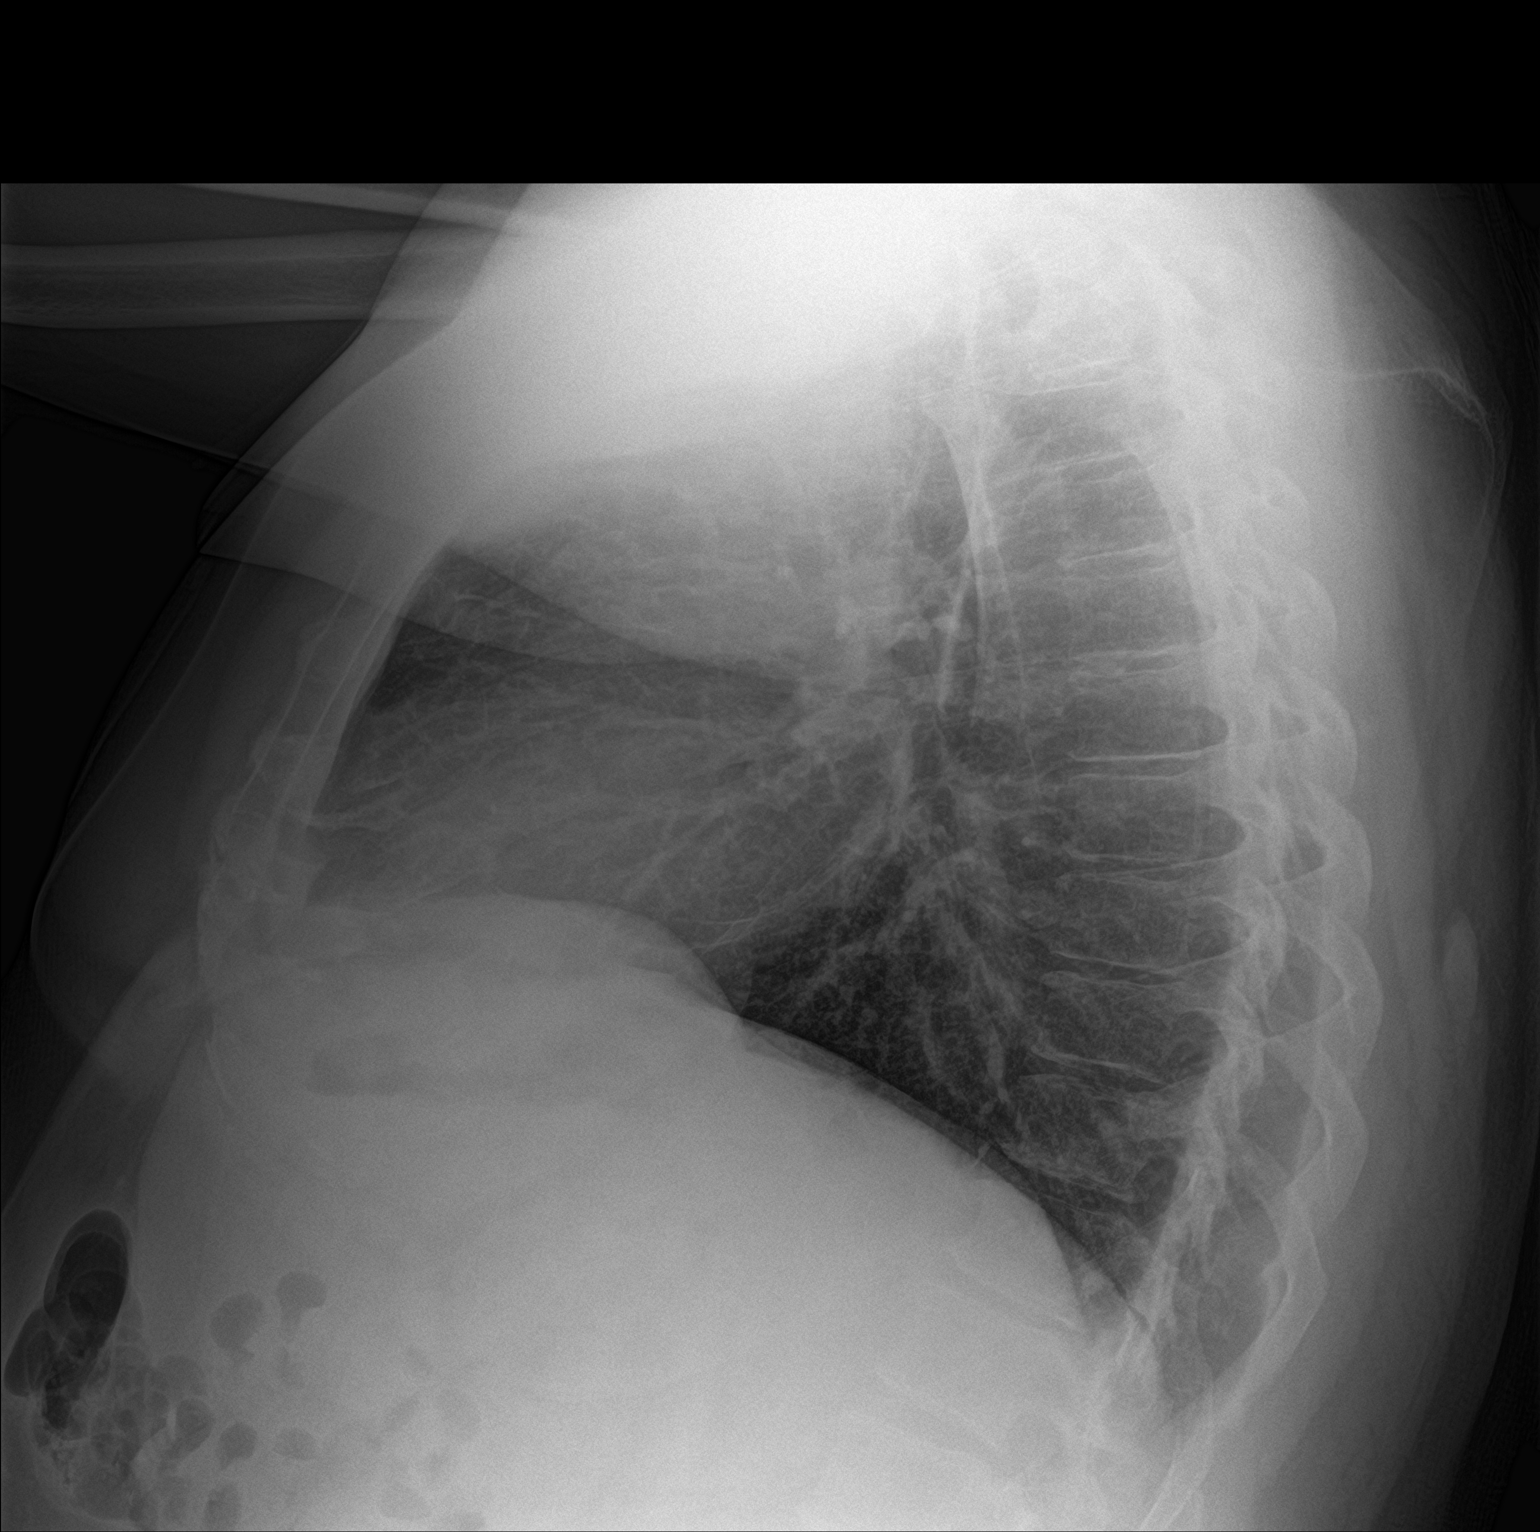

[2 of 2 positions shown; findings below may reference images not displayed]

FINDINGS: Cardiomediastinal silhouette unremarkable, unchanged. Mildly
prominent bronchovascular markings diffusely and minimal central
peribronchial thickening, unchanged. Lungs otherwise clear. No
localized airspace consolidation. No pleural effusions. No
pneumothorax. Normal pulmonary vascularity. Mild degenerative
changes involving the thoracic and upper lumbar spine.
IMPRESSION: No acute cardiopulmonary disease. Stable mild changes of chronic
bronchitis and/or asthma.

## 2020-01-28 ENCOUNTER — Telehealth: Payer: Self-pay | Admitting: General Practice

## 2020-01-28 NOTE — Telephone Encounter (Signed)
Individual has been contacted 3+ times regarding ED referral and has been given information regarding the clinic. No further attempts to contact the individual will be made. 

## 2022-05-26 ENCOUNTER — Ambulatory Visit
Admission: EM | Admit: 2022-05-26 | Discharge: 2022-05-26 | Disposition: A | Discharge status: Home / Self Care | Payer: Self-pay | Attending: Physician Assistant | Admitting: Physician Assistant

## 2022-05-26 DIAGNOSIS — K047 Periapical abscess without sinus: Secondary | ICD-10-CM

## 2022-05-26 DIAGNOSIS — K0889 Other specified disorders of teeth and supporting structures: Secondary | ICD-10-CM

## 2022-05-26 MED ORDER — AMOXICILLIN-POT CLAVULANATE 875-125 MG PO TABS: 1.0000 | ORAL_TABLET | Freq: Two times a day (BID) | ORAL | 0 refills | Status: AC

## 2022-05-26 NOTE — Discharge Instructions (Signed)
Follow up with dentist at the next available appointment.  In the meantime, we will cover for dental infection with Augmentin.  Take NSAIDs/Tylenol for pain relief. Consider Orajel also. May ice the area. Follow up with dentist in the next few days. In some cases, the tooth may needed to be extracted. Follow up with us or ER sooner if the condition worsens before the dental appointment. If they develop a fever, significant soft tissue swelling, or worse pain, go to ER  

## 2022-05-26 NOTE — ED Triage Notes (Signed)
Patient reports left upper dental pain. Patient reports that the site is tender and swollen.

## 2022-05-26 NOTE — ED Provider Notes (Signed)
[facility]    CSN: [id number] Arrival date & time: 05/26/22  1058      History   Chief Complaint Chief Complaint  Patient presents with   Dental Pain    Left upper.     HPI Olsen Mccutchan is a 46 y.o. male with known history of periodontal disease, dental caries and tobacco abuse.  Who presents today for dental pain, tooth of the left upper side for the past 1 month, worsening over the past 1 week.  He reports that he thinks the areas affected.  He has some swelling of his cheek.  He has been taking over-the-counter Tylenol for pain.  Patient reports he has had problems like this in the past with infections.  He has occasionally followed up with a dentist but says it is very expensive to have your teeth pulled so he has been pulling his own teeth.  He says he is pulled about 5 teeth.  He says he pulled on them until they become loose.  He has not had any associated fevers and he has no other complaints today.  HPI  Past Medical History:  Diagnosis Date   Anxiety     There are no problems to display for this patient.   Past Surgical History:  Procedure Laterality Date   SHOULDER SURGERY Right        Home Medications    Prior to Admission medications   Medication Sig Start Date End Date Taking? Authorizing Provider  amoxicillin-clavulanate (AUGMENTIN) 875-125 MG tablet Take 1 tablet by mouth every 12 (twelve) hours for 10 days. 05/26/22 06/05/22 Yes Eusebio Friendly B, PA-C  albuterol (PROVENTIL HFA;VENTOLIN HFA) 108 (90 Base) MCG/ACT inhaler Inhale 2 puffs into the lungs every 6 (six) hours as needed for wheezing or shortness of breath. 05/04/16   Schaevitz, Myra Rude, MD    Family History History reviewed. No pertinent family history.  Social History Social History   Tobacco Use   Smoking status: Every Day    Packs/day: 0.50    Types: Cigarettes   Smokeless tobacco: Never  Vaping Use   Vaping Use: Every day  Substance Use Topics   Alcohol use:  Yes   Drug use: No     Allergies   Ibuprofen and Nsaids   Review of Systems Review of Systems  Constitutional:  Negative for fatigue and fever.  HENT:  Positive for dental problem and facial swelling. Negative for trouble swallowing.   Gastrointestinal:  Negative for nausea and vomiting.  Neurological:  Negative for headaches.  Hematological:  Negative for adenopathy.     Physical Exam Triage Vital Signs ED Triage Vitals [05/26/22 1106]  Enc Vitals Group     BP      Pulse      Resp      Temp      Temp src      SpO2      Weight 270 lb (122.5 kg)     Height 6\' 1"  (1.854 m)     Head Circumference      Peak Flow      Pain Score 5     Pain Loc      Pain Edu?      Excl. in GC?    No data found.  Updated Vital Signs BP (!) 152/101 (BP Location: Left Arm)   Pulse 95   Temp 98.6 F (37 C) (Oral)   Ht 6\' 1"  (1.854 m)   Wt 270 lb (122.5 kg)  SpO2 98%   BMI 35.62 kg/m      Physical Exam Vitals and nursing note reviewed.  Constitutional:      General: He is not in acute distress.    Appearance: Normal appearance. He is well-developed. He is not ill-appearing.  HENT:     Head: Normocephalic and atraumatic.     Nose: Nose normal.     Mouth/Throat:     Mouth: Mucous membranes are moist.     Dentition: Abnormal dentition. Dental tenderness and dental caries present.     Pharynx: Oropharynx is clear.      Comments: Receding gingiva.  Numerous areas of dental decay and broken teeth.  The tooth indicated in the picture above is a broken area and is surrounded by swelling and erythema.  Tender to palpation and loose. Eyes:     General: No scleral icterus.    Conjunctiva/sclera: Conjunctivae normal.  Cardiovascular:     Rate and Rhythm: Normal rate and regular rhythm.  Pulmonary:     Effort: Pulmonary effort is normal. No respiratory distress.  Musculoskeletal:     Cervical back: Neck supple.  Skin:    General: Skin is warm and dry.     Capillary Refill:  Capillary refill takes less than 2 seconds.  Neurological:     General: No focal deficit present.     Mental Status: He is alert. Mental status is at baseline.     Motor: No weakness.     Gait: Gait normal.  Psychiatric:        Mood and Affect: Mood normal.      UC Treatments / Results  Labs (all labs ordered are listed, but only abnormal results are displayed) Labs Reviewed - No data to display  EKG   Radiology No results found.  Procedures Procedures (including critical care time)  Medications Ordered in UC Medications - No data to display  Initial Impression / Assessment and Plan / UC Course  I have reviewed the triage vital signs and the nursing notes.  Pertinent labs & imaging results that were available during my care of the patient were reviewed by me and considered in my medical decision making (see chart for details).   46 year old male with known history of dental disease presents for pain of foot tooth of the left upper side for the past 1 month, worsening over the past 1 week.  Has mild associated left cheek swelling.  On exam it is visible but very mild.  He does not have any fevers.  He does have receding gingiva throughout and numerous areas of dental decay.  The tooth indicated in the picture is loose and tender to palpation.  We will treat for suspected infection with Augmentin.  Advised to follow-up with a dentist as he needs to have the tooth pulled.  Encouraged her not to pull his own teeth.  Advised him to return here as needed.   Final Clinical Impressions(s) / UC Diagnoses   Final diagnoses:  Pain, dental  Dental infection     Discharge Instructions       Follow up with dentist at the next available appointment.  In the meantime, we will cover for dental infection with Augmentin.  Take NSAIDs/Tylenol for pain relief. Consider Orajel also. May ice the area. Follow up with dentist in the next few days. In some cases, the tooth may needed to be  extracted. Follow up with [place] or ER sooner if the condition worsens before the dental appointment. If  they develop a fever, significant soft tissue swelling, or worse pain, go to ER      ED Prescriptions     Medication Sig Dispense Auth. Provider   amoxicillin-clavulanate (AUGMENTIN) 875-125 MG tablet Take 1 tablet by mouth every 12 (twelve) hours for 10 days. 20 tablet Gareth Morgan      PDMP not reviewed this encounter.   Shirlee Latch, PA-C 05/26/22 1142

## 2023-09-24 ENCOUNTER — Ambulatory Visit
Admission: EM | Admit: 2023-09-24 | Discharge: 2023-09-24 | Disposition: A | Discharge status: Home / Self Care | Payer: Self-pay | Attending: Emergency Medicine | Admitting: Emergency Medicine

## 2023-09-24 ENCOUNTER — Encounter: Payer: Self-pay | Admitting: Emergency Medicine

## 2023-09-24 DIAGNOSIS — K047 Periapical abscess without sinus: Secondary | ICD-10-CM

## 2023-09-24 DIAGNOSIS — K0889 Other specified disorders of teeth and supporting structures: Secondary | ICD-10-CM

## 2023-09-24 MED ORDER — AMOXICILLIN-POT CLAVULANATE 875-125 MG PO TABS: 1.0000 | ORAL_TABLET | Freq: Two times a day (BID) | ORAL | 0 refills | Status: DC

## 2023-09-24 NOTE — ED Triage Notes (Signed)
 Patient reports left sided tooth pain that started about 2 weeks ago. Patient denies fevers.

## 2023-09-24 NOTE — Discharge Instructions (Signed)
 Take 1000 mg of Tylenol 3 or 4 times a day as needed for pain.  Warm salt water rinses and Listerine rinses, especially after you eat or drink to help irrigate out the infection.  Finish the Augmentin, even if you feel better.  Follow-up with a dentist of your choice, see list below for options.   OPTIONS FOR DENTAL FOLLOW UP CARE  [place] Department of Health and Human Services - Local Safety Net Dental Clinics TripDoors.com.htm   [facility] [phone])  [facility] [phone])  [place] [phone])  [facility] [phone])  [facility] [phone]) This clinic caters to the indigent population and is on a lottery system. Location: [employer] [facility], [employer], [address] [place] Clinic Hours: Wednesdays from 6pm - 9pm, patients seen by a lottery system. For dates, call or go to [email or web address] Services: Cleanings, fillings and simple extractions. Payment Options: DENTAL WORK IS FREE OF CHARGE. Bring proof of income or support. Best way to get seen: Arrive at 5:15 pm - this is a lottery, NOT first come/first serve, so arriving earlier will not increase your chances of being seen.     [facility] [phone] Select option 1 for emergencies   Location: [facility], [place], [address], [place] Clinic Hours: No walk-ins accepted - call the day before to schedule an appointment. Check in times are 9:30 am and 1:30 pm. Services: Simple extractions, temporary fillings, pulpectomy/pulp debridement, uncomplicated abscess drainage. Payment Options: PAYMENT IS DUE AT THE TIME OF SERVICE.  Fee is usually $100-200, additional surgical procedures (e.g. abscess drainage) may be extra. Cash, checks, Visa/MasterCard accepted.  Can file Medicaid if patient is  covered for dental - patient should call case worker to check. No discount for [facility] patients. Best way to get seen: MUST call the day before and get onto the schedule. Can usually be seen the next 1-2 days. No walk-ins accepted.     [facility] [phone]   Location: [facility], [address], [place] Clinic Hours: M, W, Th, F 8am or 1:30pm, Tues 9a or 1:30 - first come/first served. Services: Simple extractions, temporary fillings, uncomplicated abscess drainage.  You do not need to be an [facility] resident. Payment Options: PAYMENT IS DUE AT THE TIME OF SERVICE. Dental insurance, otherwise sliding scale - bring proof of income or support. Depending on income and treatment needed, cost is usually $50-200. Best way to get seen: Arrive early as it is first come/first served.     [facility] [phone]   Location: [address] Clinic Hours: Mon-Thu 8a-5p Services: Most basic dental services including extractions and fillings. Payment Options: PAYMENT IS DUE AT THE TIME OF SERVICE. Sliding scale, up to 50% off - bring proof if income or support. Medicaid with dental option accepted. Best way to get seen: Call to schedule an appointment, can usually be seen within 2 weeks OR they will try to see walk-ins - show up at 8a or 2p (you may have to wait).     [facility] [phone] [place] RESIDENTS ONLY   Location: [facility], [address], [place], [place] [address] Clinic Hours: By appointment only. Monday - Thursday 8am-5pm, Friday 8am-12pm Services: Cleanings, fillings, extractions. Payment Options: PAYMENT IS DUE AT THE TIME OF SERVICE. Cash, Visa or MasterCard. Sliding scale - $30 minimum  per service. Best way to get seen: Come in to office, complete packet and make an appointment - need proof of income or support  monies for each household member and proof of [facility] residence. Usually takes about a month to get in.     [facility] [phone]   Location: [address], [facility] Clinic Hours: Walk-in Urgent Care Dental Services are offered Monday-Friday mornings only. The numbers of emergencies accepted daily is limited to the number of providers available. Maximum 15 - Mondays, Wednesdays & Thursdays Maximum 10 - Tuesdays & Fridays Services: You do not need to be a [facility] resident to be seen for a dental emergency. Emergencies are defined as pain, swelling, abnormal bleeding, or dental trauma. Walkins will receive x-rays if needed. NOTE: Dental cleaning is not an emergency. Payment Options: PAYMENT IS DUE AT THE TIME OF SERVICE. Minimum co-pay is $40.00 for uninsured patients. Minimum co-pay is $3.00 for Medicaid with dental coverage. Dental Insurance is accepted and must be presented at time of visit. Medicare does not cover dental. Forms of payment: Cash, credit card, checks. Best way to get seen: If not previously registered with the clinic, walk-in dental registration begins at 7:15 am and is on a first come/first serve basis. If previously registered with the clinic, call to make an appointment.     [facility] [phone] [place] RESIDENTS ONLY   Location: [address] [place], [place] Clinic Hours: Mon-Thu 10a-2p Services: Extractions only! Payment Options: FREE (donations accepted) - bring proof of income or support Best way to get seen: Call and schedule an appointment OR come at 8am on the 1st Monday of every month (except for holidays) when it is first come/first served.     [facility] [phone]   Location: [address] [place], [place] Clinic Hours: Friday mornings Services, Payment Options, Best way to get seen: Call for info   Here is a list of primary care providers who are taking new  patients:  [facility] [place] Dr. Joseph Berkshire (sports medicine) Dr. Elizabeth Sauer [address] [place] [address] [phone]  [facility] at [facility] [address] [place], [place] [address] [phone]  [facility] [place] [address]  [place] [address]  [phone]  [facility] [address] [place], [place] [address] [phone]  [facility] [address] [place]  [phone] [place], [place] [address]  Here are clinics/ other resources who will see you if you do not have insurance. Some have certain criteria that you must meet. Call them and find out what they are:  [facility]: [address], [place], [place] [address] Phone: [phone] Hours: First and Third Saturdays of each Month, 9 a.m. - 1 p.m.  [facility]: [address] [place], [place] [address] Phone: [phone] Hours: Tuesday, 4 p.m. - 8 p.m. Thursday, 1 p.m. - 8 p.m. Wednesday, 9 a.m. - [facility] [address], [place], [place] [address] Phone: [phone] Pharmacy Phone Number: [phone] Dental Phone Number: [phone] [facility] Insurance Help: [phone]  Dental Hours: Monday - Thursday, 8 a.m. - 6 p.m.  [facility] [address], [place], [place] [address] Phone: [phone] Pharmacy Phone Number: [phone] [facility] Insurance Help: [phone]  [facility] [address] [place] [place], [place] [address] Phone: [phone] Pharmacy Phone Number: [phone] [facility] Insurance Help: [phone]  [facility]  [facility] [address] [place], [place] [address] Phone: [phone] [facility] Insurance Help: [phone]   [facility] [address] [place], [place] [address] Phone: [phone]  Go to www.goodrx.com  or www.costplusdrugs.com to look up your medications. This will give you a list of where you can find your  prescriptions at the most affordable prices. Or ask the pharmacist what the cash price is, or if they have any other discount programs available to help make your medication more affordable. This can be less expensive than what you would pay with insurance.

## 2023-09-24 NOTE — ED Provider Notes (Signed)
 HPI  SUBJECTIVE:  James Tapia is a 48 y.o. male who presents with 2 weeks of throbbing, constant left upper dental pain.  He reports facial and gingival swelling, nasal congestion, sinus pain and pressure starting a few days ago.  He has poor dentition in this area, and thinks that he may have cracked his tooth prior to the symptoms starting.  No fevers, purulent drainage, trismus.  No antipyretic in the past 6 hours.  He has been taking Tylenol, using hydrogen peroxide rinses and a "toothache mouthwash" without improvement in his symptoms.  He has been unable to eat for the past few days due to the pain.  He is only able to drink.  He is allergic to NSAIDs.  He has a history of hypertension.  PCP: None.  Dentist: None.    Past Medical History:  Diagnosis Date   Anxiety     Past Surgical History:  Procedure Laterality Date   SHOULDER SURGERY Right     History reviewed. No pertinent family history.  Social History   Tobacco Use   Smoking status: Every Day    Current packs/day: 0.50    Types: Cigarettes   Smokeless tobacco: Never  Vaping Use   Vaping status: Every Day  Substance Use Topics   Alcohol use: Yes   Drug use: No    No current facility-administered medications for this encounter.  Current Outpatient Medications:    amoxicillin-clavulanate (AUGMENTIN) 875-125 MG tablet, Take 1 tablet by mouth every 12 (twelve) hours., Disp: 14 tablet, Rfl: 0   albuterol (PROVENTIL HFA;VENTOLIN HFA) 108 (90 Base) MCG/ACT inhaler, Inhale 2 puffs into the lungs every 6 (six) hours as needed for wheezing or shortness of breath., Disp: 1 Inhaler, Rfl: 0  Allergies  Allergen Reactions   Ibuprofen    Nsaids Hives     ROS  As noted in HPI.   Physical Exam  BP (!) 141/107 (BP Location: Right Arm)   Pulse 84   Temp 98.7 F (37.1 C) (Oral)   Resp 15   Ht 6\' 1"  (1.854 m)   Wt 122.5 kg   SpO2 96%   BMI 35.62 kg/m   Constitutional: Well developed, well nourished, no acute  distress Eyes:  EOMI, conjunctiva normal bilaterally HENT: Normocephalic, atraumatic,mucus membranes moist.  Extensively poor dentition.  Tender, loose left upper tooth with gingival swelling.  No trismus.  No appreciable facial swelling.  No loss of the nasolabial fold.   Respiratory: Normal inspiratory effort Cardiovascular: Normal rate GI: nondistended skin: No rash, skin intact Musculoskeletal: no deformities Neurologic: Alert & oriented x 3, no focal neuro deficits Psychiatric: Speech and behavior appropriate   ED Course   Medications - No data to display  No orders of the defined types were placed in this encounter.   No results found for this or any previous visit (from the past 24 hours). No results found.  ED Clinical Impression  1. Dental abscess   2. Pain, dental      ED Assessment/Plan     Patient presents with a dental infection.  Home with Augmentin, Listerine/salt water rinses, 1000 g of Tylenol 3 or 4 times a day as needed for pain.  Will provide dental list and primary care list for follow-up.  Discussed MDM, treatment plan, and plan for follow-up with patient.patient agrees with plan.   Meds ordered this encounter  Medications   amoxicillin-clavulanate (AUGMENTIN) 875-125 MG tablet    Sig: Take 1 tablet by mouth every 12 (twelve)  hours.    Dispense:  14 tablet    Refill:  0      *This clinic note was created using Scientist, clinical (histocompatibility and immunogenetics). Therefore, there may be occasional mistakes despite careful proofreading.  ?    Domenick Gong, MD 09/25/23 1054

## 2023-11-23 ENCOUNTER — Other Ambulatory Visit: Payer: Self-pay

## 2023-11-23 ENCOUNTER — Encounter: Payer: Self-pay | Admitting: Intensive Care

## 2023-11-23 ENCOUNTER — Emergency Department: Payer: Self-pay

## 2023-11-23 ENCOUNTER — Emergency Department
Admission: EM | Admit: 2023-11-23 | Discharge: 2023-11-23 | Disposition: A | Discharge status: Home / Self Care | Payer: Self-pay | Attending: Emergency Medicine | Admitting: Emergency Medicine

## 2023-11-23 DIAGNOSIS — D72829 Elevated white blood cell count, unspecified: Secondary | ICD-10-CM | POA: Insufficient documentation

## 2023-11-23 DIAGNOSIS — R079 Chest pain, unspecified: Secondary | ICD-10-CM

## 2023-11-23 DIAGNOSIS — E876 Hypokalemia: Secondary | ICD-10-CM | POA: Diagnosis not present

## 2023-11-23 DIAGNOSIS — I10 Essential (primary) hypertension: Secondary | ICD-10-CM | POA: Insufficient documentation

## 2023-11-23 LAB — BASIC METABOLIC PANEL WITH GFR
Anion gap: 10 (ref 5–15)
BUN: 6 mg/dL (ref 6–20)
CO2: 20 mmol/L — ABNORMAL LOW (ref 22–32)
Calcium: 8.8 mg/dL — ABNORMAL LOW (ref 8.9–10.3)
Chloride: 107 mmol/L (ref 98–111)
Creatinine, Ser: 1.09 mg/dL (ref 0.61–1.24)
GFR, Estimated: >60 mL/min (ref >60)
Glucose, Bld: 96 mg/dL (ref 70–99)
Potassium: 3.4 mmol/L — ABNORMAL LOW (ref 3.5–5.1)
Sodium: 137 mmol/L (ref 135–145)

## 2023-11-23 LAB — LIPASE, BLOOD: Lipase: 31 U/L (ref 11–51)

## 2023-11-23 LAB — CBC
HCT: 45.1 % (ref 39.0–52.0)
Hemoglobin: 15.8 g/dL (ref 13.0–17.0)
MCH: 29 pg (ref 26.0–34.0)
MCHC: 35 g/dL (ref 30.0–36.0)
MCV: 82.9 fL (ref 80.0–100.0)
Platelets: 248 10*3/uL (ref 150–400)
RBC: 5.44 MIL/uL (ref 4.22–5.81)
RDW: 13.9 % (ref 11.5–15.5)
WBC: 11.2 10*3/uL — ABNORMAL HIGH (ref 4.0–10.5)
nRBC: 0 % (ref 0.0–0.2)

## 2023-11-23 LAB — HEPATIC FUNCTION PANEL
ALT: 32 U/L (ref 0–44)
AST: 25 U/L (ref 15–41)
Albumin: 4 g/dL (ref 3.5–5.0)
Alkaline Phosphatase: 54 U/L (ref 38–126)
Bilirubin, Direct: 0.3 mg/dL — ABNORMAL HIGH (ref 0.0–0.2)
Indirect Bilirubin: 0.9 mg/dL (ref 0.3–0.9)
Total Bilirubin: 1.2 mg/dL (ref 0.0–1.2)
Total Protein: 6.8 g/dL (ref 6.5–8.1)

## 2023-11-23 LAB — MAGNESIUM: Magnesium: 2.1 mg/dL (ref 1.7–2.4)

## 2023-11-23 LAB — TSH: TSH: 0.881 u[IU]/mL (ref 0.350–4.500)

## 2023-11-23 LAB — TROPONIN I (HIGH SENSITIVITY)
Troponin I (High Sensitivity): 3 ng/L (ref <18)
Troponin I (High Sensitivity): 3 ng/L (ref <18)

## 2023-11-23 LAB — D-DIMER, QUANTITATIVE: D-Dimer, Quant: 0.44 ug{FEU}/mL (ref 0.00–0.50)

## 2023-11-23 LAB — T4, FREE: Free T4: 1.38 ng/dL — ABNORMAL HIGH (ref 0.61–1.12)

## 2023-11-23 MED ORDER — AMLODIPINE BESYLATE 5 MG PO TABS
5.0000 mg | ORAL_TABLET | Freq: Every day | ORAL | 2 refills | Status: DC
Start: 2023-11-23 — End: 2023-11-23

## 2023-11-23 MED ORDER — AMLODIPINE BESYLATE 5 MG PO TABS
5.0000 mg | ORAL_TABLET | Freq: Every day | ORAL | 2 refills | Status: DC
Start: 2023-11-23 — End: 2024-01-01

## 2023-11-23 NOTE — ED Triage Notes (Signed)
 Patient c/o left sided chest pain that radiates to back. Reports feeling palpitations for a few days and today cp started.   Reports sob   A&O x4 in triage  History of anxiety

## 2023-11-23 NOTE — Discharge Instructions (Addendum)
You were seen in the Emergency Department today for evaluation of your chest pain. Fortunately, your labs, EKG, and chest x- were overall reassuring against a emergency cause for your pain. Please follow-up with your primary doctor within the next few days for reevaluation. You can take Tylenol and ibuprofen as needed for your pain, unless there is another reason that you should not take these. Return to the ER for any new or worsening symptoms including worsening chest pain, difficulty breathing, or any other new or concerning symptoms that you believe warrants immediate attention.   

## 2023-11-23 NOTE — ED Provider Notes (Signed)
 [facility] Provider Note    Event Date/Time   First MD Initiated Contact with Patient 11/23/23 1744     (approximate)   History   Chest Pain   HPI  James Tapia is a 48 year old male presenting to the emergency department for evaluation of chest pain.  Symptoms have been going on for a few days.  Reports associated palpitations.  Says he has had similar symptoms in the past but feels it is worse today.  Thinks it may be related to his anxiety.     Physical Exam   Triage Vital Signs: ED Triage Vitals  Encounter Vitals Group     BP 11/23/23 1506 (!) 154/92     Systolic BP Percentile --      Diastolic BP Percentile --      Pulse Rate 11/23/23 1506 (!) 115     Resp 11/23/23 1506 20     Temp 11/23/23 1512 98.2 F (36.8 C)     Temp Source 11/23/23 1512 Oral     SpO2 11/23/23 1506 99 %     Weight --      Height 11/23/23 1506 6\' 1"  (1.854 m)     Head Circumference --      Peak Flow --      Pain Score 11/23/23 1506 8     Pain Loc --      Pain Education --      Exclude from Growth Chart --     Most recent vital signs: Vitals:   11/23/23 1523 11/23/23 1742  BP: (!) 138/92 (!) 133/94  Pulse:  89  Resp:  18  Temp:    SpO2:  100%     General: Awake, interactive  CV:  Regular rate at the time of my initial evaluation, good peripheral perfusion.  Chest wall: Mild generalized tenderness Resp:  Unlabored respirations, lungs good auscultation Abd:  Nondistended, soft, nontender Neuro:  Symmetric facial movement, fluid speech   ED Results / Procedures / Treatments   Labs (all labs ordered are listed, but only abnormal results are displayed) Labs Reviewed  BASIC METABOLIC PANEL WITH GFR - Abnormal; Notable for the following components:      Result Value   Potassium 3.4 (*)    CO2 20 (*)    Calcium 8.8 (*)    All other components within normal limits  CBC - Abnormal; Notable for the following components:   WBC 11.2 (*)    All other  components within normal limits  T4, FREE - Abnormal; Notable for the following components:   Free T4 1.38 (*)    All other components within normal limits  HEPATIC FUNCTION PANEL - Abnormal; Notable for the following components:   Bilirubin, Direct 0.3 (*)    All other components within normal limits  MAGNESIUM  TSH  LIPASE, BLOOD  D-DIMER, QUANTITATIVE  TROPONIN I (HIGH SENSITIVITY)  TROPONIN I (HIGH SENSITIVITY)     EKG EKG independently reviewed interpreted by myself (ER attending) demonstrates:  EKG demonstrates normal sinus rhythm rate of 100, PR 134, QRS 90, QTc 451, no acute ST changes  RADIOLOGY Imaging independently reviewed and interpreted by myself demonstrates:  CXR without focal consolidation  Formal Radiology Read:  DG Chest 2 View Result Date: 11/23/2023 CLINICAL DATA:  Chest pain.  Radiates to the back. EXAM: CHEST - 2 VIEW COMPARISON:  AP chest 08/03/2019 FINDINGS: Cardiac silhouette and mediastinal contours are within normal limits. The lungs are clear. No pleural effusion or  pneumothorax. No acute skeletal abnormality. IMPRESSION: No active cardiopulmonary disease. Electronically Signed   By: Bertina Broccoli M.D.   On: 11/23/2023 16:04    PROCEDURES:  Critical Care performed: No  Procedures   MEDICATIONS ORDERED IN ED: Medications - No data to display   IMPRESSION / MDM / ASSESSMENT AND PLAN / ED COURSE  I reviewed the triage vital signs and the nursing notes.  Differential diagnosis includes, but is not limited to, arrhythmia, anemia, ACS, pneumonia, pneumothorax, PE  Patient's presentation is most consistent with acute presentation with potential threat to life or bodily function.  48 year old male presenting with chest pain.  Tachycardic on presentation, improved at the time of my initial evaluation.  Labs with mild leukocytosis, overall reassuring.  Negative D-dimer.  EKG and x-Alita Waldren reassuring.  Patient reassessed and updated on results of workup.   He is comfortable with discharge and outpatient follow-up.  He does note that he has had elevated blood pressure readings during multiple prior visits to care.  Reviewed blood pressure 148/108.  Do think it be reasonable to start patient on amlodipine  with plans for outpatient follow-up.  Strict return precautions provided.  Patient discharged stable condition.    FINAL CLINICAL IMPRESSION(S) / ED DIAGNOSES   Final diagnoses:  Nonspecific chest pain  Uncontrolled hypertension     Rx / DC Orders   ED Discharge Orders          Ordered    amLODipine  (NORVASC ) 5 MG tablet  Daily,   Status:  Discontinued        11/23/23 1932    Ambulatory Referral to Primary Care (Establish Care)        11/23/23 1932    amLODipine  (NORVASC ) 5 MG tablet  Daily        11/23/23 1932             Note:  This document was prepared using Dragon voice recognition software and may include unintentional dictation errors.   Claria Crofts, MD 11/23/23 [phone]

## 2023-11-23 NOTE — ED Provider Triage Note (Signed)
 Emergency Medicine Provider Triage Evaluation Note  James Tapia , a 48 y.o. male  was evaluated in triage.  Pt complains of CP. Radiates to the back, describes it as a stabbing pain.  Started with palpitations and now is having pain.  Review of Systems  Positive: CP Negative:   Physical Exam  BP (!) 154/92 (BP Location: Right Arm)   Pulse (!) 115   Temp 98.2 F (36.8 C) (Oral)   Resp 20   Ht 6\' 1"  (1.854 m)   SpO2 99%   BMI 35.62 kg/m  Gen:   Awake, no distress   Resp:  Normal effort  MSK:   Moves extremities without difficulty  Other:  Radial pulses are equal bilaterally  Medical Decision Making  Medically screening exam initiated at 3:18 PM.  Appropriate orders placed.  Charvez Privott was informed that the remainder of the evaluation will be completed by another provider, this initial triage assessment does not replace that evaluation, and the importance of remaining in the ED until their evaluation is complete.     Phyliss Breen, PA-C 11/23/23 1523

## 2023-12-18 ENCOUNTER — Ambulatory Visit
Admission: EM | Admit: 2023-12-18 | Discharge: 2023-12-18 | Disposition: A | Discharge status: Home / Self Care | Payer: Self-pay

## 2023-12-18 DIAGNOSIS — M25472 Effusion, left ankle: Secondary | ICD-10-CM

## 2023-12-18 DIAGNOSIS — M25474 Effusion, right foot: Secondary | ICD-10-CM

## 2023-12-18 DIAGNOSIS — Z720 Tobacco use: Secondary | ICD-10-CM

## 2023-12-18 DIAGNOSIS — M25475 Effusion, left foot: Secondary | ICD-10-CM

## 2023-12-18 DIAGNOSIS — M25471 Effusion, right ankle: Secondary | ICD-10-CM

## 2023-12-18 DIAGNOSIS — R202 Paresthesia of skin: Secondary | ICD-10-CM

## 2023-12-18 HISTORY — DX: Essential (primary) hypertension: I10

## 2023-12-18 LAB — GLUCOSE, CAPILLARY: Glucose-Capillary: 110 mg/dL — ABNORMAL HIGH (ref 70–99)

## 2023-12-18 NOTE — ED Triage Notes (Signed)
 Patient woke up this morning with bilateral feet swelling. Tingling and hurts

## 2023-12-18 NOTE — ED Provider Notes (Signed)
 [facility]    CSN: [id number] Arrival date & time: 12/18/23  1052      History   Chief Complaint Chief Complaint  Patient presents with   Foot Swelling    HPI James Tapia is a 48 y.o. male.   48 year old male pt, James Tapia, presents to urgent care for evaluation of bilateral feet swelling since this morning. Pt states his feet are tingling and hurt, pt started Norvasc  recenlty.   Pt endorses vaping, quit smoking cigarettes. No PCP(has info, is "working with some people and insurance to get some things done, just recently got insurance and getting a PCP isn't my top priority now")  The history is provided by the patient. No language interpreter was used.    Past Medical History:  Diagnosis Date   Anxiety    Hypertension     Patient Active Problem List   Diagnosis Date Noted   Bilateral swelling of feet and ankles 12/18/2023   Paresthesia of both feet 12/18/2023   Vapes nicotine containing substance 12/18/2023    Past Surgical History:  Procedure Laterality Date   SHOULDER SURGERY Right        Home Medications    Prior to Admission medications   Medication Sig Start Date End Date Taking? Authorizing Provider  amLODipine  (NORVASC ) 5 MG tablet Take 1 tablet (5 mg total) by mouth daily. 11/23/23 02/21/24 Yes Ray, Auburn Leak, MD  DULoxetine  (CYMBALTA ) 30 MG capsule Take 30 mg by mouth daily.   Yes [provider]  traZODone (DESYREL) 50 MG tablet Take 50 mg by mouth at bedtime.   Yes [provider]  albuterol  (PROVENTIL  HFA;VENTOLIN  HFA) 108 (90 Base) MCG/ACT inhaler Inhale 2 puffs into the lungs every 6 (six) hours as needed for wheezing or shortness of breath. 05/04/16   Schaevitz, Mancel Seashore, MD  amoxicillin -clavulanate (AUGMENTIN ) 875-125 MG tablet Take 1 tablet by mouth every 12 (twelve) hours. 09/24/23   Ethlyn Herd, MD    Family History History reviewed. No pertinent family history.  Social History Social History    Tobacco Use   Smoking status: Every Day    Current packs/day: 0.50    Types: Cigarettes   Smokeless tobacco: Never  Vaping Use   Vaping status: Former  Substance Use Topics   Alcohol use: Not Currently   Drug use: No     Allergies   Ibuprofen  and Nsaids   Review of Systems Review of Systems  Constitutional:  Negative for fever.  Cardiovascular:        Feet and leg swelling  Musculoskeletal:        Feet/ankle swelling  Neurological:  Negative for weakness and numbness.       Tingling of feet  All other systems reviewed and are negative.    Physical Exam Triage Vital Signs ED Triage Vitals [12/18/23 1103]  Encounter Vitals Group     BP      Systolic BP Percentile      Diastolic BP Percentile      Pulse      Resp      Temp      Temp src      SpO2      Weight      Height      Head Circumference      Peak Flow      Pain Score 6     Pain Loc      Pain Education      Exclude from Growth  Chart    No data found.  Updated Vital Signs BP 128/86 (BP Location: Right Arm)   Pulse 77   Temp 97.9 F (36.6 C) (Oral)   Resp 15   SpO2 99%   Visual Acuity Right Eye Distance:   Left Eye Distance:   Bilateral Distance:    Right Eye Near:   Left Eye Near:    Bilateral Near:     Physical Exam Vitals and nursing note reviewed.  Constitutional:      Appearance: He is well-developed and well-groomed.  Cardiovascular:     Rate and Rhythm: Normal rate and regular rhythm.     Pulses:          Dorsalis pedis pulses are 2+ on the right side and 2+ on the left side.  Pulmonary:     Effort: Pulmonary effort is normal.  Musculoskeletal:     Right foot: Swelling present. Normal pulse.     Left foot: Swelling present. Normal pulse.     Comments: Mild swelling to bilateral feet and ankles  Neurological:     General: No focal deficit present.     Mental Status: He is alert and oriented to person, place, and time.     GCS: GCS eye subscore is 4. GCS verbal subscore  is 5. GCS motor subscore is 6.     Cranial Nerves: No cranial nerve deficit.     Sensory: Sensation is intact.  Psychiatric:        Attention and Perception: Attention normal.        Mood and Affect: Mood normal.        Speech: Speech normal.        Behavior: Behavior normal. Behavior is cooperative.      UC Treatments / Results  Labs (all labs ordered are listed, but only abnormal results are displayed) Labs Reviewed  GLUCOSE, CAPILLARY - Abnormal; Notable for the following components:      Result Value   Glucose-Capillary 110 (*)    All other components within normal limits    EKG   Radiology No results found.  Procedures Procedures (including critical care time)  Medications Ordered in UC Medications - No data to display  Initial Impression / Assessment and Plan / UC Course  I have reviewed the triage vital signs and the nursing notes.  Pertinent labs & imaging results that were available during my care of the patient were reviewed by me and considered in my medical decision making (see chart for details).    Discussed exam findings and plan of care with patient, strict go to ER precautions given.   Patient verbalized understanding to this provider.  Ddx: Bilateral swelling of feet and ankles, paresthesia, DVT , CHF, vape use Final Clinical Impressions(s) / UC Diagnoses   Final diagnoses:  Bilateral swelling of feet and ankles  Paresthesia of both feet  Vapes nicotine containing substance     Discharge Instructions      Please get established PCP of your choice Your swelling may be due to your use of amlodipine  May obtain compression stockings and wear to help with swelling in lower extremities Watch your salt intake Stop smoking/vaping If you have new or worsening symptoms(fever, increase in swelling,shortness of breath, chest pain, palpitations,etc) go to the emergency room for further evaluation Your blood sugar was 110 in office today    ED  Prescriptions   None    PDMP not reviewed this encounter.   Peter Brands, NP 12/18/23 1756

## 2023-12-18 NOTE — Discharge Instructions (Addendum)
 Please get established PCP of your choice Your swelling may be due to your use of amlodipine  May obtain compression stockings and wear to help with swelling in lower extremities Watch your salt intake Stop smoking/vaping If you have new or worsening symptoms(fever, increase in swelling,shortness of breath, chest pain, palpitations,etc) go to the emergency room for further evaluation Your blood sugar was 110 in office today

## 2023-12-19 ENCOUNTER — Ambulatory Visit: Payer: Self-pay

## 2023-12-19 NOTE — Telephone Encounter (Signed)
 Chief Complaint: feet swelling Symptoms: feet swelling Frequency: x 1 day Pertinent Negatives: Patient denies injury Disposition: [] ED /[x] Urgent Care (no appt availability in office) / [x] Appointment(In office/virtual)/ []  [facility]/ [] Home Care/ [] Refused Recommended Disposition /[] [facility]/ []  Follow-up with PCP  Additional Notes: pt states that he went to UC yesterday and he had some swelling of both feet. States they are sore and skin is tight. States that this just started on yesterday. States he was started on a new BP medication a month. States swelling is the same as yesterday no changes. States feet are tender and tylenol help just a little.   Copied from CRM [id number]. Topic: Clinical - Red Word Triage >> Dec 19, 2023  1:53 PM Star [place] wrote: Red Word that prompted transfer to Nurse Triage: both feet swollen, tender to walk on, just started yesterday bp 128/86 yesterday Reason for Disposition  MILD or MODERATE ankle swelling (e.g., can't move joint normally, can't do usual activities) (Exceptions: Itchy, localized swelling; swelling is chronic.)  Protocols used: Ankle Swelling-A-AH

## 2023-12-21 ENCOUNTER — Ambulatory Visit (INDEPENDENT_AMBULATORY_CARE_PROVIDER_SITE_OTHER): Payer: Self-pay | Admitting: Family Medicine

## 2023-12-21 ENCOUNTER — Encounter: Payer: Self-pay | Admitting: Family Medicine

## 2023-12-21 VITALS — BP 127/89 | HR 87 | Resp 20 | Ht 73.0 in | Wt 238.0 lb

## 2023-12-21 DIAGNOSIS — M25471 Effusion, right ankle: Secondary | ICD-10-CM | POA: Diagnosis not present

## 2023-12-21 DIAGNOSIS — M25475 Effusion, left foot: Secondary | ICD-10-CM

## 2023-12-21 DIAGNOSIS — F411 Generalized anxiety disorder: Secondary | ICD-10-CM | POA: Diagnosis not present

## 2023-12-21 DIAGNOSIS — M25472 Effusion, left ankle: Secondary | ICD-10-CM

## 2023-12-21 DIAGNOSIS — M25551 Pain in right hip: Secondary | ICD-10-CM

## 2023-12-21 DIAGNOSIS — M25474 Effusion, right foot: Secondary | ICD-10-CM

## 2023-12-21 DIAGNOSIS — F331 Major depressive disorder, recurrent, moderate: Secondary | ICD-10-CM | POA: Diagnosis not present

## 2023-12-21 DIAGNOSIS — I10 Essential (primary) hypertension: Secondary | ICD-10-CM | POA: Diagnosis not present

## 2023-12-21 DIAGNOSIS — Z139 Encounter for screening, unspecified: Secondary | ICD-10-CM

## 2023-12-21 DIAGNOSIS — G8929 Other chronic pain: Secondary | ICD-10-CM

## 2023-12-21 DIAGNOSIS — Z7689 Persons encountering health services in other specified circumstances: Secondary | ICD-10-CM

## 2023-12-21 MED ORDER — DULOXETINE HCL 60 MG PO CPEP: 60.0000 mg | ORAL_CAPSULE | Freq: Every day | ORAL | 2 refills | Status: DC

## 2023-12-21 MED ORDER — HYDROCHLOROTHIAZIDE 12.5 MG PO CAPS: 12.5000 mg | ORAL_CAPSULE | Freq: Every day | ORAL | 2 refills | Status: DC

## 2023-12-21 NOTE — Progress Notes (Signed)
 New Patient Office Visit  Subjective   Patient ID: James Tapia, male    DOB: Jul 02, 1976  Age: 48 y.o. MRN: [id number]  CC:  Chief Complaint  Patient presents with   Establish Care   Foot Swelling    Bilateral. 3 days   Joint Swelling   HPI James Tapia is a 48 year old male who presents to establish with [facility] at [facility]   CC: Patient here to establish care  Last PCP: none   Patient reports that his life circumstances are difficult at this time, as he is currently sleeping in his car.  He has concerns about the following issues:  Lower leg swelling: bilateral, denies redness & warmth  Has been on amlodipine  for about a month  Reports he is not sedentary and is mostly active throughout the day    HTN: has always had issues with his blood pressure Denies chest pain, shob, headache, vision changes. Reports peripheral edema.   Mood: Cymbalta  30mg  daily  He went about a month and a half ago to [facility] started on Cymbalta  & trazodone. He reports his mood is not improving   R hip pain:  Has chronic body pain  He was in a forklift accident in 2008, he reports both shoulders have ripped out of the socket  He thinks this is when the hip pain started      12/21/2023   10:23 AM  GAD 7 : Generalized Anxiety Score  Nervous, Anxious, on Edge 3  Control/stop worrying 3  Worry too much - different things 3  Trouble relaxing 3  Restless 3  Easily annoyed or irritable 3  Afraid - awful might happen 3  Total GAD 7 Score 21  Anxiety Difficulty Very difficult       12/21/2023   10:22 AM  PHQ9 SCORE ONLY  PHQ-9 Total Score 22    Outpatient Encounter Medications as of 12/21/2023  Medication Sig   albuterol  (PROVENTIL  HFA;VENTOLIN  HFA) 108 (90 Base) MCG/ACT inhaler Inhale 2 puffs into the lungs every 6 (six) hours as needed for wheezing or shortness of breath.   amLODipine  (NORVASC ) 5 MG tablet Take 1 tablet (5 mg total) by mouth daily.    cyclobenzaprine  (FLEXERIL ) 10 MG tablet Take 10 mg by mouth 3 (three) times daily as needed for muscle spasms.   hydrochlorothiazide  (MICROZIDE ) 12.5 MG capsule Take 1 capsule (12.5 mg total) by mouth daily.   traZODone (DESYREL) 50 MG tablet Take 50 mg by mouth at bedtime.   [DISCONTINUED] DULoxetine  (CYMBALTA ) 30 MG capsule Take 30 mg by mouth daily.   DULoxetine  (CYMBALTA ) 60 MG capsule Take 1 capsule (60 mg total) by mouth daily.   [DISCONTINUED] amoxicillin -clavulanate (AUGMENTIN ) 875-125 MG tablet Take 1 tablet by mouth every 12 (twelve) hours.   No facility-administered encounter medications on file as of 12/21/2023.    Patient Active Problem List   Diagnosis Date Noted   GAD (generalized anxiety disorder) 12/21/2023   MDD (major depressive disorder), recurrent episode, moderate (HCC) 12/21/2023   Primary hypertension 12/21/2023   Bilateral swelling of feet and ankles 12/18/2023   Paresthesia of both feet 12/18/2023   Vapes nicotine containing substance 12/18/2023   Past Medical History:  Diagnosis Date   Anxiety    Hypertension    Past Surgical History:  Procedure Laterality Date   SHOULDER SURGERY Right    History reviewed. No pertinent family history. Social History   Socioeconomic History   Marital status: Married  Spouse name: Not on file   Number of children: Not on file   Years of education: Not on file   Highest education level: Not on file  Occupational History   Not on file  Tobacco Use   Smoking status: Every Day    Current packs/day: 0.50    Types: Cigarettes    Passive exposure: Current   Smokeless tobacco: Never  Vaping Use   Vaping status: Former  Substance and Sexual Activity   Alcohol use: Not Currently   Drug use: No   Sexual activity: Not on file  Other Topics Concern   Not on file  Social History Narrative   Not on file   Social Drivers of Health   Financial Resource Strain: Not on file  Food Insecurity: Not on file   Transportation Needs: Not on file  Physical Activity: Not on file  Stress: Not on file  Social Connections: Not on file  Intimate Partner Violence: Not on file   Outpatient Medications Prior to Visit  Medication Sig Dispense Refill   albuterol  (PROVENTIL  HFA;VENTOLIN  HFA) 108 (90 Base) MCG/ACT inhaler Inhale 2 puffs into the lungs every 6 (six) hours as needed for wheezing or shortness of breath. 1 Inhaler 0   amLODipine  (NORVASC ) 5 MG tablet Take 1 tablet (5 mg total) by mouth daily. 30 tablet 2   cyclobenzaprine  (FLEXERIL ) 10 MG tablet Take 10 mg by mouth 3 (three) times daily as needed for muscle spasms.     traZODone (DESYREL) 50 MG tablet Take 50 mg by mouth at bedtime.     DULoxetine (CYMBALTA) 30 MG capsule Take 30 mg by mouth daily.     amoxicillin -clavulanate (AUGMENTIN ) 875-125 MG tablet Take 1 tablet by mouth every 12 (twelve) hours. 14 tablet 0   No facility-administered medications prior to visit.   Allergies  Allergen Reactions   Ibuprofen     Nsaids Hives   ROS: see HPI    Objective   Today's Vitals   12/21/23 1000 12/21/23 1105  BP: (!) 150/97 127/89  Pulse: 94 87  Resp: 20   SpO2: 95%   Weight: 238 lb (108 kg)   Height: 6\' 1"  (1.854 m)   PainSc: 10-Worst pain ever   PainLoc: Hip    GENERAL: Well-appearing, in NAD. Well nourished.  SKIN: Pink, warm and dry. No rash, lesion, ulceration, or ecchymoses.  Head: Normocephalic. NECK: Trachea midline. Full ROM w/o pain or tenderness. No lymphadenopathy.  EARS: Tympanic membranes are intact, translucent without bulging and without drainage. Appropriate landmarks visualized.  EYES: Conjunctiva clear without exudates. EOMI, PERRL, no drainage present.  NOSE: Septum midline w/o deformity. Nares patent, mucosa pink and non-inflamed w/o drainage. No sinus tenderness.  THROAT: Uvula midline. Oropharynx clear. Tonsils non-inflamed without exudate. Mucous membranes pink and moist.  RESPIRATORY: Chest wall symmetrical.  Respirations even and non-labored. Breath sounds clear to auscultation bilaterally.  CARDIAC: S1, S2 present, regular rate and rhythm without murmur or gallops. Peripheral pulses 2+ bilaterally.  MSK: Muscle tone and strength appropriate for age. Joints w/o tenderness, redness, or swelling.  EXTREMITIES: Without clubbing, cyanosis, or edema.  NEUROLOGIC: No motor or sensory deficits. Steady, even gait. C2-C12 intact.  PSYCH/MENTAL STATUS: Alert, oriented x 3. Cooperative, appropriate mood and affect.     Assessment & Plan:   1. Encounter to establish care (Primary) Patient is a 3- year-old male who presents today to establish care with primary care at [facility]. Reviewed the past medical history, family history, social history, surgical history,  medications and allergies today- updates made as indicated. Patient has concerns today about his mood, peripheral edema and right hip pain.    2. GAD (generalized anxiety disorder) GAD7 completed with score of 21. Denies issues with panic attacks, shortness of breath, difficulty breathing, palpitations, hyperventilation, and dizziness. Discussed benefits of cognitive behavioral therapy (CBT) and first-line pharmacotherapy concurrently. He would like to increase Cymbalta since he does not notice an improvement in his mood. Rx sent for 60mg  daily. Plan for  4-6 week follow-up.  - DULoxetine (CYMBALTA) 60 MG capsule; Take 1 capsule (60 mg total) by mouth daily.  Dispense: 30 capsule; Refill: 2  3. MDD (major depressive disorder), recurrent episode, severe (HCC) PHQ9 completed with score of 22. Denies active or passive suicidal ideations. Rx sent for 60mg  daily. Plan for  4-6 week follow-up.  - DULoxetine (CYMBALTA) 60 MG capsule; Take 1 capsule (60 mg total) by mouth daily.  Dispense: 30 capsule; Refill: 2  4. Primary hypertension Patient presents today with elevated blood pressure 150/97, repeat blood pressure improved 127/89. Patient in no acute  distress and is well-appearing. Denies chest pain, shortness of breath, lower extremity edema, vision changes, headaches. Cardiovascular exam with heart regular rate and rhythm. Normal heart sounds, no murmurs present. Bilateral lower extremity non-pitting edema present. Lungs clear to auscultation bilaterally. Patient is currently taking amlodipine  5mg  daily. No refills needed. Will check kidney function and electrolytes. Depending on results, will notify patient to start hydrochlorothiazide 12.5mg  daily for bilateral peripheral edema. Follow-up in 4 weeks.    - Basic Metabolic Panel (BMET) - hydrochlorothiazide (MICROZIDE) 12.5 MG capsule; Take 1 capsule (12.5 mg total) by mouth daily.  Dispense: 30 capsule; Refill: 2  5. Bilateral swelling of feet and ankles Physical exam without tenderness to palpation, erythema, unilateral swelling and warmth. Denies shortness of breath, chest pain, headache, calf pain, and pain with ambulation in lower extremities. Patient is not in a financial position to monitor blood pressure and/or purchase compression socks. Discussed getting BMP and starting hydrochlorothiazide if results return within normal range to help decrease peripheral edema. Follow-up in 4 weeks. Patient would benefit from [facility] referral.  - Basic Metabolic Panel (BMET) - hydrochlorothiazide (MICROZIDE) 12.5 MG capsule; Take 1 capsule (12.5 mg total) by mouth daily.  Dispense: 30 capsule; Refill: 2  6. Chronic right hip pain Discussed using Tylenol for pain relief, along with topical creams and use of ice as needed. Discussed use of gabapentin for pain relief, due to allergies with NSAIDs. Patient declines trial of this medication and referral to pain clinic at this time.    7. Other chronic pain Patient reports he was previously followed by pain management for 5 years due to chronic pain. Counseled patient about pain management and importance of seeing pain clinic. He does not wish to return there  and declines pharmacological options offered today.    Return in about 4 weeks (around 01/18/2024) for HTN follow-up.   Wilhelmena Hanson, FNP

## 2023-12-21 NOTE — Patient Instructions (Signed)

## 2023-12-22 ENCOUNTER — Ambulatory Visit: Payer: Self-pay | Admitting: Family Medicine

## 2023-12-22 LAB — BASIC METABOLIC PANEL WITH GFR
BUN/Creatinine Ratio: 7 — ABNORMAL LOW (ref 9–20)
BUN: 8 mg/dL (ref 6–24)
CO2: 23 mmol/L (ref 20–29)
Calcium: 9.2 mg/dL (ref 8.7–10.2)
Chloride: 101 mmol/L (ref 96–106)
Creatinine, Ser: 1.07 mg/dL (ref 0.76–1.27)
Glucose: 95 mg/dL (ref 70–99)
Potassium: 3.8 mmol/L (ref 3.5–5.2)
Sodium: 142 mmol/L (ref 134–144)
eGFR: 86 mL/min/{1.73_m2} (ref >59)

## 2023-12-26 ENCOUNTER — Telehealth: Payer: Self-pay

## 2023-12-26 NOTE — Progress Notes (Signed)
 Care Guide Pharmacy Note  12/26/2023 Name: James Tapia MRN: [id number] DOB: Jun 06, 1976  Referred By: Wilhelmena Hanson, FNP Reason for referral: Complex Care Management (Outreach to schedule with Pharm d )   James Tapia is a 48 y.o. year old male who is a primary care patient of Wilhelmena Hanson, FNP.  James Tapia was referred to the pharmacist for assistance related to: HTN  Successful contact was made with the patient to discuss pharmacy services including being ready for the pharmacist to call at least 5 minutes before the scheduled appointment time and to have medication bottles and any blood pressure readings ready for review. The patient agreed to meet with the pharmacist via telephone visit on (date/time).12/27/2023 Lenton Rail , RMA     [facility], [facility] Guide  Direct Dial: [phone]  Website: [email or web address]

## 2023-12-27 ENCOUNTER — Other Ambulatory Visit (HOSPITAL_COMMUNITY): Payer: Self-pay

## 2023-12-27 ENCOUNTER — Other Ambulatory Visit: Payer: Self-pay | Admitting: Pharmacist

## 2023-12-27 ENCOUNTER — Telehealth: Payer: Self-pay | Admitting: Pharmacist

## 2023-12-27 ENCOUNTER — Telehealth: Payer: Self-pay

## 2023-12-27 DIAGNOSIS — I10 Essential (primary) hypertension: Secondary | ICD-10-CM

## 2023-12-27 NOTE — Progress Notes (Addendum)
 Complex Care Management Note Care Guide Note  12/27/2023 Name: James Tapia MRN: [id number] DOB: 1976/06/11  James Tapia is a 48 y.o. year old male who is a primary care patient of Wilhelmena Hanson, FNP . The community resource team was consulted for assistance with housing.  SDOH screenings and interventions completed:  Yes  Social Drivers of Health From This Encounter   Food Insecurity: No Food Insecurity (12/27/2023)   Hunger Vital Sign    Worried About Running Out of Food in the Last Year: Never true    Ran Out of Food in the Last Year: Never true  Housing: High Risk (12/27/2023)   Housing Stability Vital Sign    Unable to Pay for Housing in the Last Year: Yes    Homeless in the Last Year: Yes  Financial Resource Strain: High Risk (12/27/2023)   Overall Financial Resource Strain (CARDIA)    Difficulty of Paying Living Expenses: Very hard  Transportation Needs: No Transportation Needs (12/27/2023)   PRAPARE - Administrator, Civil Service (Medical): No    Lack of Transportation (Non-Medical): No    SDOH Interventions Today    Flowsheet Row Most Recent Value  SDOH Interventions   Housing Interventions Other (Comment)  [Emailed housing information for [place].]  Transportation Interventions Other (Comment), Payor Benefit  [Patient has transportation benefit with Medicaid [facility].]  Financial Strain Interventions Other (Comment)  [Emailed [employer] [place] [employer], [place] [facility] Healthy Opportunities Program,]        Care guide performed the following interventions: Spoke with patient, he disclosed that he is currently unhoused and living in his car. Emailed [employer], housing, food, [facility] Healthy Engineer, maintenance (IT). [email or web address].  Follow Up Plan:  No further follow up planned at this time. The patient has been provided with needed  resources.  Encounter Outcome:  Patient Visit Completed  Sher[facility]spaugh [facility]  [facility] Guide Direct Dial: [phone]  Fax: [phone] Website: [email or web address]

## 2023-12-27 NOTE — Progress Notes (Unsigned)
 Patient is currently without a home BP monitor, but eligible to receive one through his Medicaid coverage.   Would you please fax a Rx for an upper arm blood pressure monitor to [facility] at fax # [phone], including on Rx the ICD-10 code? **DME supplier also requests a copy of recent in-person provider note    Thank you!   Arthur Lash, PharmD, [facility] [phone]

## 2023-12-27 NOTE — Progress Notes (Unsigned)
   12/27/2023 Name: James Tapia  MRN: [id number] DOB: 1976-04-10  Chief Complaint  Patient presents with   Medication Assistance    James Tapia is a 48 y.o. year old male who presented for a telephone visit.   They were referred to the pharmacist by their PCP for assistance in managing medication access.    Subjective:  Care Team: Primary Care Provider: Wilhelmena Hanson, FNP ; Next Scheduled Visit: 01/19/2024   Medication Access/Adherence  Current Pharmacy:  [facility] [address] [place] - [address] [place] [address] Phone: [phone] Fax: [phone]  [name] [id number] - [place], [place] - [address] [place] [address] Phone: [phone] Fax: [phone]   Patient reports affordability concerns with their medications: Yes  Patient reports access/transportation concerns to their pharmacy: No  Patient reports adherence concerns with their medications:  No    Reports has not yet been able to pick up his HCTZ or duloxetine  prescriptions due to medication cost  Reports has applied for Medicaid, but unsure if he has coverage  Shares that he is currently living out of his vehicle and needing help with housing/medical costs - From review of chart, note PCP placed referral related to SDOH needs  Hypertension:  Current medications:  - amlodipine  5 mg daily  Patient has discussed with PCP  - Reports has not yet started HCTZ 12.5 mg daily  Patient does not have a validated, automated, upper arm home BP cuff; denies monitoring home BP  Admits to adding salt to his food when cooks, but not when eating. Reports eating fast food sometimes recently   Current physical activity: walking 15,000 steps/day  Attributes to elevated reading in office due to pain   Objective:   Lab Results  Component Value Date   CREATININE 1.07 12/21/2023   BUN 8 12/21/2023   NA 142 12/21/2023   K 3.8  12/21/2023   CL 101 12/21/2023   CO2 23 12/21/2023   BP Readings from Last 3 Encounters:  12/21/23 127/89  12/18/23 128/86  11/23/23 (!) 146/108   Pulse Readings from Last 3 Encounters:  12/21/23 87  12/18/23 77  11/23/23 74     Medications Reviewed Today   Medications were not reviewed in this encounter       Assessment/Plan:   Hypertension: - Currently uncontrolled - Reviewed long term cardiovascular and renal outcomes of uncontrolled blood pressure - Reviewed appropriate blood pressure monitoring technique and reviewed goal blood pressure. - Patient is currently without a home BP monitor, but eligible to receive one through his Medicaid coverage Will collaborate with PCP to request provide send Rx for an upper arm blood pressure monitor to [facility] at fax # [phone], including on Rx the ICD-10 code. DME supplier also requests a copy of recent in-person provider note Provide patient with phone number for [facility] - Recommended to check home blood pressure and heart rate *** - Recommend to ***      Follow Up Plan: ***  ***

## 2023-12-29 ENCOUNTER — Encounter: Payer: Self-pay | Admitting: Pharmacist

## 2023-12-29 MED ORDER — BLOOD PRESSURE MONITOR DEVI: 1.0000 | Freq: Every day | 1 refills | Status: DC

## 2023-12-29 MED ORDER — BLOOD PRESSURE MONITOR/ARM DEVI: 1.0000 | Freq: Every day | 0 refills | Status: AC

## 2023-12-29 NOTE — Addendum Note (Signed)
 Addended by: Hershell Lose on: 12/29/2023 09:39 AM   Modules accepted: Orders

## 2023-12-29 NOTE — Telephone Encounter (Signed)
 Prescription was faxed to [facility].

## 2023-12-29 NOTE — Patient Instructions (Signed)
 Goals Addressed             This Visit's Progress    Pharmacy Goals       As we discussed, I have asked your provider to send a prescription for a blood pressure monitor to [facility] for you.    [facility] [address] [place], [place] [address] Monday- Friday: 9 am to 4:30 pm Phone: [phone]   Please call to follow up with [facility] about your upper arm blood pressure monitor. If anything further is needed from your primary care office, you can reach the office at [phone].     Once you receive you monitor, please check your blood pressure at least twice weekly, and any time you have concerning symptoms like headache, chest pain, dizziness, shortness of breath, or vision changes.    Our goal is less than 130/80.   To appropriately check your blood pressure, make sure you do the following:  1) Avoid caffeine, exercise, or tobacco products for 30 minutes before checking. Empty your bladder. 2) Sit with your back supported in a flat-backed chair. Rest your arm on something flat (arm of the chair, table, etc). 3) Sit still with your feet flat on the floor, resting, for at least 5 minutes.  4) Check your blood pressure. Take 1-2 readings.  5) Write down these readings and bring with you to any provider appointments.   Bring your home blood pressure machine with you to a provider's office for accuracy comparison at least once a year.    Make sure you take your blood pressure medications before you come to any office visit, even if you were asked to fast for labs.   Thank you!   Arthur Lash, PharmD, [facility] [phone]

## 2024-01-01 ENCOUNTER — Ambulatory Visit (INDEPENDENT_AMBULATORY_CARE_PROVIDER_SITE_OTHER): Admitting: Family Medicine

## 2024-01-01 VITALS — BP 157/112 | HR 80 | Temp 98.3°F | Ht 73.0 in | Wt 226.0 lb

## 2024-01-01 DIAGNOSIS — F5101 Primary insomnia: Secondary | ICD-10-CM

## 2024-01-01 DIAGNOSIS — I10 Essential (primary) hypertension: Secondary | ICD-10-CM

## 2024-01-01 DIAGNOSIS — M25471 Effusion, right ankle: Secondary | ICD-10-CM

## 2024-01-01 DIAGNOSIS — M25474 Effusion, right foot: Secondary | ICD-10-CM

## 2024-01-01 DIAGNOSIS — M25472 Effusion, left ankle: Secondary | ICD-10-CM

## 2024-01-01 DIAGNOSIS — F331 Major depressive disorder, recurrent, moderate: Secondary | ICD-10-CM | POA: Diagnosis not present

## 2024-01-01 DIAGNOSIS — F411 Generalized anxiety disorder: Secondary | ICD-10-CM | POA: Diagnosis not present

## 2024-01-01 DIAGNOSIS — M25475 Effusion, left foot: Secondary | ICD-10-CM

## 2024-01-01 MED ORDER — TRAZODONE HCL 50 MG PO TABS
150.0000 mg | ORAL_TABLET | Freq: Every day | ORAL | 0 refills | Status: DC
Start: 2024-01-01 — End: 2024-05-01

## 2024-01-01 MED ORDER — ALBUTEROL SULFATE HFA 108 (90 BASE) MCG/ACT IN AERS: 1.0000 | INHALATION_SPRAY | Freq: Four times a day (QID) | RESPIRATORY_TRACT | 0 refills | Status: DC | PRN

## 2024-01-01 MED ORDER — HYDROCHLOROTHIAZIDE 12.5 MG PO CAPS: 12.5000 mg | ORAL_CAPSULE | Freq: Every day | ORAL | 0 refills | Status: DC

## 2024-01-01 MED ORDER — AMLODIPINE BESYLATE 5 MG PO TABS: 5.0000 mg | ORAL_TABLET | Freq: Every day | ORAL | 0 refills | Status: DC

## 2024-01-01 MED ORDER — DULOXETINE HCL 60 MG PO CPEP: 60.0000 mg | ORAL_CAPSULE | Freq: Every day | ORAL | 0 refills | Status: DC

## 2024-01-01 NOTE — Patient Instructions (Signed)

## 2024-01-01 NOTE — Progress Notes (Signed)
 Acute Care Office Visit  Subjective:   James Tapia 27-Oct-1975 01/01/2024  Chief Complaint  Patient presents with   Hypertension   Insomnia   CC: Patient is here for concerns about insomnia.   HYPERTENSION: James Tapia presents for the medical management of hypertension.  Patient's current hypertension medication regimen is: amlodipine  5mg  daily & hydrochlorothiazide  12.5mg  daily  Patient is currently taking prescribed medications for HTN.  Denies headache, dizziness, CP, SHOB, vision changes.   BP Readings from Last 3 Encounters:  01/01/24 (!) 134/98  12/21/23 127/89  12/18/23 128/86   INSOMNIA- trazodone 50mg  daily at bedtime- he reports it worked in the beginning for 3-4 hours  Dosed off for about 2 hours and then back up. He reports he is still sleeping in his car.  Patient reports he is moving to [place]  soon because he cannot handle his current living situation.   The following portions of the patient's history were reviewed and updated as appropriate: past medical history, past surgical history, family history, social history, allergies, medications, and problem list.   Patient Active Problem List   Diagnosis Date Noted   GAD (generalized anxiety disorder) 12/21/2023   MDD (major depressive disorder), recurrent episode, moderate (HCC) 12/21/2023   Primary hypertension 12/21/2023   Bilateral swelling of feet and ankles 12/18/2023   Paresthesia of both feet 12/18/2023   Vapes nicotine containing substance 12/18/2023   Past Medical History:  Diagnosis Date   Anxiety    Hypertension    Past Surgical History:  Procedure Laterality Date   SHOULDER SURGERY Right    No family history on file. Outpatient Medications Prior to Visit  Medication Sig Dispense Refill   acetaminophen (TYLENOL) 500 MG tablet Take 500 mg by mouth every 6 (six) hours as needed.     Blood Pressure Monitoring (BLOOD PRESSURE MONITOR/ARM) DEVI 1 kit by Does not apply route daily. 1  each 0   cyclobenzaprine  (FLEXERIL ) 10 MG tablet Take 10 mg by mouth 3 (three) times daily as needed for muscle spasms.     amLODipine  (NORVASC ) 5 MG tablet Take 1 tablet (5 mg total) by mouth daily. 30 tablet 2   DULoxetine  (CYMBALTA ) 60 MG capsule Take 1 capsule (60 mg total) by mouth daily. 30 capsule 2   hydrochlorothiazide  (MICROZIDE ) 12.5 MG capsule Take 1 capsule (12.5 mg total) by mouth daily. 30 capsule 2   traZODone (DESYREL) 50 MG tablet Take 50 mg by mouth at bedtime.     albuterol  (PROVENTIL  HFA;VENTOLIN  HFA) 108 (90 Base) MCG/ACT inhaler Inhale 2 puffs into the lungs every 6 (six) hours as needed for wheezing or shortness of breath. (Patient not taking: Reported on 01/01/2024) 1 Inhaler 0   No facility-administered medications prior to visit.   Allergies  Allergen Reactions   Ibuprofen     Nsaids Hives   ROS: A complete ROS was performed with pertinent positives/negatives noted in the HPI. The remainder of the ROS are negative.    Objective:   Today's Vitals   01/01/24 1519  BP: (!) 134/98  Pulse: 100  Temp: 98.3 F (36.8 C)  TempSrc: Oral  Weight: 226 lb (102.5 kg)  Height: 6\' 1"  (1.854 m)   GENERAL: Well-appearing, in NAD. Well nourished.  SKIN: Pink, warm and dry. No rash, lesion, ulceration, or ecchymoses.  Head: Normocephalic. NECK: Trachea midline. Full ROM w/o pain or tenderness. No lymphadenopathy.  EARS: Tympanic membranes are intact, translucent without bulging and without drainage. Appropriate landmarks visualized.  EYES:  Conjunctiva clear without exudates. EOMI, PERRL, no drainage present.  NOSE: Septum midline w/o deformity. Nares patent, mucosa pink and non-inflamed w/o drainage. No sinus tenderness.  THROAT: Uvula midline. Oropharynx clear. Tonsils non-inflamed without exudate. Mucous membranes pink and moist.  RESPIRATORY: Chest wall symmetrical. Respirations even and non-labored. Breath sounds clear to auscultation bilaterally.  CARDIAC: S1, S2  present, regular rate and rhythm without murmur or gallops. Peripheral pulses 2+ bilaterally.  MSK: Muscle tone and strength appropriate for age. Joints w/o tenderness, redness, or swelling.  EXTREMITIES: Without clubbing, cyanosis, or edema.  NEUROLOGIC: No motor or sensory deficits. Steady, even gait. C2-C12 intact.  PSYCH/MENTAL STATUS: Alert, oriented x 3. Cooperative, appropriate mood and affect.   Assessment & Plan:   1. Primary hypertension (Primary) Patient presents today with slightly elevated blood pressure, repeat blood pressure increased. Patient in no acute distress and is well-appearing. Denies chest pain, shortness of breath, lower extremity edema, vision changes, headaches. Cardiovascular exam with heart regular rate and rhythm. Normal heart sounds, no murmurs present. No lower extremity edema present. Lungs clear to auscultation bilaterally. Patient is currently taking amlodipine  5mg  & hydrochlorothiazide  12.5mg  daily. Refills provided today. - amLODipine  (NORVASC ) 5 MG tablet; Take 1 tablet (5 mg total) by mouth daily.  Dispense: 90 tablet; Refill: 0 - hydrochlorothiazide  (MICROZIDE ) 12.5 MG capsule; Take 1 capsule (12.5 mg total) by mouth daily.  Dispense: 90 capsule; Refill: 0  2. Primary insomnia Patient reports he is having a difficult time staying asleep with 50-100mg  trazodone prn at bedtime. He reports not having a difficult time falling asleep but staying asleep. In the past 3 nights, he reports he has only slept about 3 hours total. He reports he is still sleeping in his car and is having a difficult time with life circumstances. Discussed increasing dose of trazodone to 150-200mg  at bedtime prn. Rx sent to pharmacy on file.  - traZODone (DESYREL) 50 MG tablet; Take 3-4 tablets (150-200 mg total) by mouth at bedtime.  Dispense: 120 tablet; Refill: 0  3. GAD (generalized anxiety disorder) Rx sent to pharmacy on file.  - DULoxetine  (CYMBALTA ) 60 MG capsule; Take 1 capsule  (60 mg total) by mouth daily.  Dispense: 90 capsule; Refill: 0  4. MDD (major depressive disorder), recurrent episode, moderate (HCC) Rx sent to pharmacy on file.  - DULoxetine  (CYMBALTA ) 60 MG capsule; Take 1 capsule (60 mg total) by mouth daily.  Dispense: 90 capsule; Refill: 0  5. Bilateral swelling of feet and ankles Rx sent to pharmacy on file.  - hydrochlorothiazide  (MICROZIDE ) 12.5 MG capsule; Take 1 capsule (12.5 mg total) by mouth daily.  Dispense: 90 capsule; Refill: 0   Return in about 4 weeks (around 01/29/2024) for HTN follow-up.    Patient to reach out to office if new, worrisome, or unresolved symptoms arise or if no improvement in patient's condition. Patient verbalized understanding and is agreeable to treatment plan. All questions answered to patient's satisfaction.    Wilhelmena Hanson, FNP

## 2024-01-10 ENCOUNTER — Ambulatory Visit: Admitting: Family Medicine

## 2024-01-19 ENCOUNTER — Ambulatory Visit: Payer: Self-pay | Admitting: Family Medicine

## 2024-02-07 ENCOUNTER — Other Ambulatory Visit: Payer: Self-pay | Admitting: Pharmacist

## 2024-02-07 DIAGNOSIS — I10 Essential (primary) hypertension: Secondary | ICD-10-CM

## 2024-02-07 NOTE — Progress Notes (Signed)
 02/07/2024 Name: James Tapia MRN: [id number] DOB: January 31, 1976  Chief Complaint  Patient presents with   Medication Adherence   Medication Management    James Tapia is a 48 y.o. year old male who presented for a telephone visit.   They were referred to the pharmacist by their PCP for assistance in managing hypertension and medication access.      Subjective:   Care Team: Primary Care Provider: Towana Small, FNP    Medication Access/Adherence  Current Pharmacy:  [facility] [address], [place] - [address] [place] [address] Phone: [phone] Fax: [phone]   Patient reports affordability concerns with their medications: Yes  Patient reports access/transportation concerns to their pharmacy: No  Patient reports adherence concerns with their medications:  No     Shares preference to avoid taking additional medications; does not like taking medications  Reports currently in [place]  for the past few weeks, staying with family. Reports planning to return to [place] within the next 2 weeks. - Note in [place] living out of his vehicle   Reports was recently out of his HCTZ for ~1 week as was unable to afford cost of prescription, but since picked up and restarted    Hypertension:   Current medications:  - amlodipine  5 mg daily - HCTZ 12.5 mg daily   Patient does not have a validated, automated, upper arm home BP cuff, but has been using family member's. Recalls recent readings:  - Yesterday: 121/90 - 7/6: 120/80   Denies having yet contacted [facility] regarding BP monitor, but plans to when he returns to [facility]  Admits to adding salt to his food when cooks, but not when eating. Reports eating fast food sometimes recently    Current physical activity: walking his dog a couple of miles/day    Tobacco Abuse:  Denies smoking recently, but currently vaping  Not ready to quit vaping now, but planing to quit in  future   Objective:  No results found for: HGBA1C  Lab Results  Component Value Date   CREATININE 1.07 12/21/2023   BUN 8 12/21/2023   NA 142 12/21/2023   K 3.8 12/21/2023   CL 101 12/21/2023   CO2 23 12/21/2023    BP Readings from Last 3 Encounters:  01/01/24 (!) 157/112  12/21/23 127/89  12/18/23 128/86   Pulse Readings from Last 3 Encounters:  01/01/24 80  12/21/23 87  12/18/23 77    Current Outpatient Medications on File Prior to Visit  Medication Sig Dispense Refill   amLODipine  (NORVASC ) 5 MG tablet Take 1 tablet (5 mg total) by mouth daily. 90 tablet 0   hydrochlorothiazide  (MICROZIDE ) 12.5 MG capsule Take 1 capsule (12.5 mg total) by mouth daily. 90 capsule 0   acetaminophen (TYLENOL) 500 MG tablet Take 500 mg by mouth every 6 (six) hours as needed.     albuterol  (VENTOLIN  HFA) 108 (90 Base) MCG/ACT inhaler Inhale 1-2 puffs into the lungs every 6 (six) hours as needed for wheezing or shortness of breath. 1 each 0   Blood Pressure Monitoring (BLOOD PRESSURE MONITOR/ARM) DEVI 1 kit by Does not apply route daily. 1 each 0   cyclobenzaprine  (FLEXERIL ) 10 MG tablet Take 10 mg by mouth 3 (three) times daily as needed for muscle spasms.     DULoxetine  (CYMBALTA ) 60 MG capsule Take 1 capsule (60 mg total) by mouth daily. 90 capsule 0   traZODone  (DESYREL ) 50 MG tablet Take 3-4 tablets (150-200  mg total) by mouth at bedtime. 120 tablet 0   No current facility-administered medications on file prior to visit.     Assessment/Plan:   Advise patient to contact office to schedule follow up visit with PCP  Hypertension: - Currently uncontrolled - Reviewed long term cardiovascular and renal outcomes of uncontrolled blood pressure - Reviewed appropriate blood pressure monitoring technique and reviewed goal blood pressure. - Patient plans to pick up and start taking HCTZ 12.5 mg daily as directed and continue amlodipine  5 mg daily.  - Patient without a home BP monitor, but  eligible to receive one through his Medicaid coverage. Prescription has been sent to [facility] for patient Patient to follow up with [facility] - Once able to obtain BP monitor, recommended to check home blood pressure and heart rate, keep log of results and have this record to review at upcoming medical appointments. Patient to contact provider office sooner if needed for readings outside of established parameters or symptoms     Follow Up Plan: Clinical Pharmacist will follow up with patient by telephone on 03/27/2024 at 9:00 AM    Sharyle Sia, PharmD, [facility] [phone]

## 2024-02-07 NOTE — Patient Instructions (Signed)
 Goals Addressed             This Visit's Progress    Pharmacy Goals       As we discussed, I have asked your provider to send a prescription for a blood pressure monitor to [facility] for you.    [facility] [address] [place], [place] [address] Monday- Friday: 9 am to 4:30 pm Phone: [phone]   Please call to follow up with [facility] about your upper arm blood pressure monitor. If anything further is needed from your primary care office, you can reach the office at [phone].     Once you receive you monitor, please check your blood pressure at least twice weekly, and any time you have concerning symptoms like headache, chest pain, dizziness, shortness of breath, or vision changes.    Our goal is less than 130/80.   To appropriately check your blood pressure, make sure you do the following:  1) Avoid caffeine, exercise, or tobacco products for 30 minutes before checking. Empty your bladder. 2) Sit with your back supported in a flat-backed chair. Rest your arm on something flat (arm of the chair, table, etc). 3) Sit still with your feet flat on the floor, resting, for at least 5 minutes.  4) Check your blood pressure. Take 1-2 readings.  5) Write down these readings and bring with you to any provider appointments.   Bring your home blood pressure machine with you to a provider's office for accuracy comparison at least once a year.    Make sure you take your blood pressure medications before you come to any office visit, even if you were asked to fast for labs.   Thank you!   Arthur Lash, PharmD, [facility] [phone]

## 2024-03-27 ENCOUNTER — Other Ambulatory Visit: Payer: Self-pay | Admitting: Pharmacist

## 2024-03-27 DIAGNOSIS — I10 Essential (primary) hypertension: Secondary | ICD-10-CM

## 2024-03-27 NOTE — Progress Notes (Unsigned)
 03/27/2024 Name: James Tapia MRN: [id number] DOB: 1976-07-24  Chief Complaint  Patient presents with   Medication Management    Therman Hughlett is a 48 y.o. year old male who presented for a telephone visit.   They were referred to the pharmacist by their PCP for assistance in managing hypertension and medication access.      Subjective:   Care Team: Primary Care Provider: Towana Small, FNP  Medication Access/Adherence  Current Pharmacy:  [facility] [address] [place] - [address] [place] [address] Phone: [phone] Fax: [phone]   Patient reports affordability concerns with their medications: No  Patient reports access/transportation concerns to their pharmacy: No  Patient reports adherence concerns with their medications:  No        Hypertension:   Current medications:  - amlodipine  5 mg daily - HCTZ 12.5 mg daily   Patient has an automated, upper arm home BP cuff. Recalls last checked 9/1, reading: ~120/85   Admits to adding salt to his food when cooks, but not when eating. Reports eating fast food sometimes recently    Current physical activity: walking his dog a couple of miles/day     Tobacco Abuse:   Denies smoking recently, but currently vaping   Not ready to quit vaping now, but planing to quit in future - Reports has been working on cutting back   Objective:   Lab Results  Component Value Date   CREATININE 1.07 12/21/2023   BUN 8 12/21/2023   NA 142 12/21/2023   K 3.8 12/21/2023   CL 101 12/21/2023   CO2 23 12/21/2023     Medications Reviewed Today     Reviewed by Alana Sharyle LABOR, RPH-CPP (Pharmacist) on 03/27/24 at [phone]  Med List Status: <None>   Medication Order Taking? Sig Documenting Provider Last Dose Status Informant  acetaminophen (TYLENOL) 500 MG tablet [id number]  Take 500 mg by mouth every 6 (six) hours as needed. [provider]  Active   albuterol  (VENTOLIN  HFA)  108 (90 Base) MCG/ACT inhaler [id number]  Inhale 1-2 puffs into the lungs every 6 (six) hours as needed for wheezing or shortness of breath. Towana Small, FNP  Active   amLODipine  (NORVASC ) 5 MG tablet [id number] Yes Take 1 tablet (5 mg total) by mouth daily. Towana Small, FNP  Active   Blood Pressure Monitoring (BLOOD PRESSURE MONITOR/ARM) DEVI [id number]  1 kit by Does not apply route daily. Towana Small, FNP  Active   cyclobenzaprine  (FLEXERIL ) 10 MG tablet [id number]  Take 10 mg by mouth 3 (three) times daily as needed for muscle spasms. [provider]  Active   DULoxetine  (CYMBALTA ) 60 MG capsule [id number]  Take 1 capsule (60 mg total) by mouth daily. Towana Small, FNP  Active   hydrochlorothiazide  (MICROZIDE ) 12.5 MG capsule [id number] Yes Take 1 capsule (12.5 mg total) by mouth daily. Towana Small, FNP  Active   traZODone  (DESYREL ) 50 MG tablet [id number]  Take 3-4 tablets (150-200 mg total) by mouth at bedtime. Towana Small, FNP  Active               Assessment/Plan:   Again advise patient to contact office to schedule follow up visit with PCP   Hypertension: - Currently uncontrolled - Reviewed long term cardiovascular and renal outcomes of uncontrolled blood pressure - Reviewed appropriate blood pressure monitoring technique and reviewed goal blood pressure. - Patient plans to pick up refills of HCTZ and amlodipine  5 mg from  pharmacy today - Recommended to check home blood pressure and heart rate, keep log of results and have this record to review at upcoming medical appointments. Patient to contact provider office sooner if needed for readings outside of established parameters or symptoms     Follow Up Plan:   Patient denies further medication questions or concerns today Provide patient with contact information for clinic pharmacist to contact if needed in future for medication questions/concerns    Sharyle Sia, PharmD, [facility] [phone]

## 2024-03-29 ENCOUNTER — Other Ambulatory Visit: Payer: Self-pay

## 2024-03-29 DIAGNOSIS — I10 Essential (primary) hypertension: Secondary | ICD-10-CM

## 2024-03-29 DIAGNOSIS — M25474 Effusion, right foot: Secondary | ICD-10-CM

## 2024-03-29 MED ORDER — HYDROCHLOROTHIAZIDE 12.5 MG PO CAPS: 12.5000 mg | ORAL_CAPSULE | Freq: Every day | ORAL | 0 refills | Status: DC

## 2024-03-29 NOTE — Patient Instructions (Signed)
 Goals Addressed             This Visit's Progress    Pharmacy Goals       Please check your blood pressure at least twice weekly, and any time you have concerning symptoms like headache, chest pain, dizziness, shortness of breath, or vision changes.    Our goal is less than 130/80.   To appropriately check your blood pressure, make sure you do the following:  1) Avoid caffeine, exercise, or tobacco products for 30 minutes before checking. Empty your bladder. 2) Sit with your back supported in a flat-backed chair. Rest your arm on something flat (arm of the chair, table, etc). 3) Sit still with your feet flat on the floor, resting, for at least 5 minutes.  4) Check your blood pressure. Take 1-2 readings.  5) Write down these readings and bring with you to any provider appointments.   Bring your home blood pressure machine with you to a provider's office for accuracy comparison at least once a year.    Make sure you take your blood pressure medications before you come to any office visit, even if you were asked to fast for labs.   Thank you!    Sharyle Sia, PharmD, [facility] [phone]

## 2024-05-01 ENCOUNTER — Encounter: Payer: Self-pay | Admitting: Pharmacist

## 2024-05-01 ENCOUNTER — Other Ambulatory Visit: Payer: Self-pay | Admitting: Family Medicine

## 2024-05-01 ENCOUNTER — Telehealth: Payer: Self-pay | Admitting: Pharmacist

## 2024-05-01 ENCOUNTER — Other Ambulatory Visit: Payer: Self-pay

## 2024-05-01 ENCOUNTER — Other Ambulatory Visit: Payer: Self-pay | Admitting: Pharmacist

## 2024-05-01 DIAGNOSIS — F331 Major depressive disorder, recurrent, moderate: Secondary | ICD-10-CM

## 2024-05-01 DIAGNOSIS — M25471 Effusion, right ankle: Secondary | ICD-10-CM

## 2024-05-01 DIAGNOSIS — F411 Generalized anxiety disorder: Secondary | ICD-10-CM

## 2024-05-01 DIAGNOSIS — I10 Essential (primary) hypertension: Secondary | ICD-10-CM

## 2024-05-01 DIAGNOSIS — F5101 Primary insomnia: Secondary | ICD-10-CM

## 2024-05-01 MED ORDER — HYDROCHLOROTHIAZIDE 12.5 MG PO CAPS
12.5000 mg | ORAL_CAPSULE | Freq: Every day | ORAL | 0 refills | Status: DC
  Filled 2024-05-01: qty 90, 90d supply, fill #0

## 2024-05-01 MED ORDER — ALBUTEROL SULFATE HFA 108 (90 BASE) MCG/ACT IN AERS
1.0000 | INHALATION_SPRAY | Freq: Four times a day (QID) | RESPIRATORY_TRACT | 0 refills | Status: DC | PRN
  Filled 2024-05-01: qty 18, 25d supply, fill #0

## 2024-05-01 MED ORDER — AMLODIPINE BESYLATE 5 MG PO TABS
5.0000 mg | ORAL_TABLET | Freq: Every day | ORAL | 0 refills | Status: DC
  Filled 2024-05-01: qty 90, 90d supply, fill #0

## 2024-05-01 MED ORDER — TRAZODONE HCL 50 MG PO TABS
50.0000 mg | ORAL_TABLET | Freq: Every day | ORAL | 0 refills | Status: DC
  Filled 2024-05-01: qty 120, 60d supply, fill #0

## 2024-05-01 MED ORDER — DULOXETINE HCL 60 MG PO CPEP
60.0000 mg | ORAL_CAPSULE | Freq: Every day | ORAL | 0 refills | Status: DC
  Filled 2024-05-01: qty 90, 90d supply, fill #0

## 2024-05-01 NOTE — Progress Notes (Signed)
   05/01/2024  Patient ID: James Tapia, male   DOB: [birth year], 48 y.o.   MRN: [id number]  Receive a voicemail from patient requesting a call back related to medication access.  Return call to patient. Reports that he is currently out of all of his albuterol  inhaler, amlodipine , duloxetine , HCTZ and trazodone . Reports albuterol  inhaler, amlodipine , duloxetine  and trazodone  prescriptions are out of refills and he also is unable to afford his copayments at this time. Patient interested in an option to be able to obtain his medications today and pay his copayment at a later time  Note [facility] is able to bill patient for the copayments.   Patient scheduled for upcoming follow up Office Visit with PCP on 05/06/2024   Collaborate with PCP. Provider sends renewal of these prescriptions for patient to [facility] - Follow up with patient to provide update. Patient plans to follow up to [facility] today to pick up his medications  Follow Up Plan: Clinical Pharmacist will follow up with patient by telephone next month   Sharyle Sia, PharmD, [facility] [phone]

## 2024-05-01 NOTE — Patient Instructions (Signed)
 As requested, your provider has sent new prescriptions for you to [facility] today. Please follow up with pharmacy to pick up these medications.  [facility] [address] [place], [place] [address] [phone]   Thank you!  Sharyle Sia, PharmD, [facility] [phone]

## 2024-05-01 NOTE — Progress Notes (Signed)
 Good Morning! Received a call from patient. He is currently out of his albuterol  inhaler, amlodipine , duloxetine , HCTZ and trazodone . Out of medications both because 4 of these prescriptions are out of refills, but also because he is currently unable to afford the copayments.  [facility] is able to bill patient for the copayments.  Note patient scheduled for follow up Office Visit on 05/06/2024  Would you please consider sending refills for albuterol  inhaler, amlodipine , duloxetine , HCTZ and trazodone  to [facility] for patient today?  Thank you!  Sharyle Sia, PharmD, [facility] [phone]

## 2024-05-06 ENCOUNTER — Ambulatory Visit: Admitting: Family Medicine

## 2024-05-07 ENCOUNTER — Ambulatory Visit: Admitting: Family Medicine

## 2024-05-07 ENCOUNTER — Encounter: Payer: Self-pay | Admitting: Family Medicine

## 2024-05-07 ENCOUNTER — Ambulatory Visit: Payer: Self-pay | Admitting: Family Medicine

## 2024-05-07 VITALS — BP 138/90 | HR 92 | Resp 16 | Ht 73.0 in | Wt 219.0 lb

## 2024-05-07 DIAGNOSIS — R200 Anesthesia of skin: Secondary | ICD-10-CM

## 2024-05-07 DIAGNOSIS — S73191A Other sprain of right hip, initial encounter: Secondary | ICD-10-CM

## 2024-05-07 DIAGNOSIS — G8929 Other chronic pain: Secondary | ICD-10-CM | POA: Diagnosis not present

## 2024-05-07 DIAGNOSIS — M542 Cervicalgia: Secondary | ICD-10-CM

## 2024-05-07 DIAGNOSIS — M25551 Pain in right hip: Secondary | ICD-10-CM

## 2024-05-07 DIAGNOSIS — M25552 Pain in left hip: Secondary | ICD-10-CM

## 2024-05-07 DIAGNOSIS — M25511 Pain in right shoulder: Secondary | ICD-10-CM | POA: Diagnosis not present

## 2024-05-07 DIAGNOSIS — R202 Paresthesia of skin: Secondary | ICD-10-CM

## 2024-05-07 DIAGNOSIS — K439 Ventral hernia without obstruction or gangrene: Secondary | ICD-10-CM

## 2024-05-07 NOTE — Patient Instructions (Addendum)
 Smoking Cessation: Quitline[employer] 1-800-QUIT-NOW (463)411-4416); Espaol: 1-855-Djelo-Ya (8-144-664-6430) http://carroll-castaneda.info/     Mental Health Resources  988: can call or text 24/7  [facility]: Address: [address], [place], [place] [address] Open 24 hours Phone: [phone]  [facility]: Address: [address], [place], [place] [address] Phone: [phone] Appointments: [email or web address]   [facility] Address: [address] [place], [place] [address] Hours:  Open 24 hours Phone: [phone]    MyChart:  For all urgent or time sensitive needs we ask that you please call the office to avoid delays. Our number is [phone]) [id number]. MyChart is not constantly monitored and due to the large volume of messages a day, replies may take up to 72 business hours.   MyChart Policy: MyChart allows for you to see your visit notes, after visit summary, provider recommendations, lab and tests results, make an appointment, request refills, and contact your provider or the office for non-urgent questions or concerns. Providers are seeing patients during normal business hours and do not have built in time to review MyChart messages.  We ask that you allow a minimum of 3 business days for responses to [employer]. For this reason, please do not send urgent requests through MyChart. Please call the office at [phone]. New and ongoing conditions may require a visit. We have virtual and in person visit available for your convenience.  Complex MyChart concerns may require a visit. Your provider may request you schedule a virtual or in person visit to ensure we are providing the best care possible. MyChart messages sent after 11:00 AM on Friday will not be received by the provider until Monday morning.    Lab and Test Results: You will receive your lab and test results on MyChart as soon as  they are completed and results have been sent by the lab or testing facility. Due to this service, you will receive your results BEFORE your provider.  I review lab and tests results each morning prior to seeing patients. Some results require collaboration with other providers to ensure you are receiving the most appropriate care. For this reason, we ask that you please allow a minimum of 3-5 business days from the time the ALL results have been received for your provider to receive and review lab and test results and contact you about these.  Most lab and test result comments from the provider will be sent through MyChart. Your provider may recommend changes to the plan of care, follow-up visits, repeat testing, ask questions, or request an office visit to discuss these results. You may reply directly to this message or call the office at [phone] to provide information for the provider or set up an appointment. In some instances, you will be called with test results and recommendations. Please let us  know if this is preferred and we will make note of this in your chart to provide this for you.    If you have not heard a response to your lab or test results in 5 business days from all results returning to MyChart, please call the office to let us  know. We ask that you please avoid calling prior to this time unless there is an emergent concern. Due to high call volumes, this can delay the resulting process.   After Hours: For all non-emergency after hours needs, please call the office at [phone] and select the option to reach the on-call provider  service. On-call services are shared between multiple [place] offices and therefore it will not be possible to speak directly with your provider. On-call providers may provide medical advice and recommendations, but are unable to provide refills for maintenance medications.  For all emergency or urgent medical needs after normal business hours, we  recommend that you seek care at the closest Urgent Care or Emergency Department to ensure appropriate treatment in a timely manner.  [facility] has a 24 hour emergency room located on the ground floor for your convenience.    Urgent Concerns During the Business Day Providers are seeing patients from 8AM to 5PM, Monday through Thursday, and 8AM to 12PM on Friday with a busy schedule and are most often not able to respond to non-urgent calls until the end of the day or the next business day. If you should have URGENT concerns during the day, please call and speak to the nurse or schedule a same day appointment so that we can address your concern without delay.    Thank you, again, for choosing me as your health care partner. I appreciate your trust and look forward to learning more about you.    Evalene Arts, FNP-C

## 2024-05-07 NOTE — Telephone Encounter (Signed)
 FYI Only or Action Required?: FYI only for provider.  Patient was last seen in primary care on 01/01/2024 by Towana Small, FNP.  Called Nurse Triage reporting Abdominal Pain.  Symptoms began several years ago.  Triage Disposition: See Physician Within 24 Hours  Patient/caregiver understands and will follow disposition?: Yes              Copied from CRM [id number]. Topic: Clinical - Red Word Triage >> May 07, 2024 11:25 AM Avram MATSU wrote: Red Word that prompted transfer to Nurse Triage: pain in stomach believes he has a hernia, hurts when he cough.    ----------------------------------------------------------------------- From previous Reason for Contact - Scheduling: Patient/patient representative is calling to schedule an appointment. Refer to attachments for appointment information. Reason for Disposition  [1] MODERATE pain (e.g., interferes with normal activities) AND [2] pain comes and goes (cramps) AND [3] present > 24 hours  (Exception: Pain with Vomiting or Diarrhea - see that Guideline.)  Answer Assessment - Initial Assessment Questions This RN scheduled pt for an appointment today with PCP in office. Pt also wanted to schedule an appointment to establish care with Darice Petty, NP, as he states his friend goes to her. Pt scheduled to establish care on 12/16, the first available appointment.  Pt states he is about 80% sure the stomach pain is a hernia. Pt states he just recently got insurance. Pt notices the stomach pain gets worse when he coughs and it's like it is going to pop out  Denies discoloration to potential hernia area Maybe a little bit of changes in bowel habits; pt states he is going through a lot of stressful events  LOCATION: Where does it hurt?      A little lower than sternum in the middle  RADIATION: Does the pain shoot anywhere else? (e.g., chest, back)     Denies  ONSET: When did the pain begin? (Minutes, hours or days ago)       At least the past couple of years; has not been previously diagnosed with hernia in this area  PATTERN Does the pain come and go, or is it constant?     Intermittent with coughing or sneezing  SEVERITY: How bad is the pain?  (e.g., Scale 1-10; mild, moderate, or severe)     8/10 pain level  CAUSE: What do you think is causing the stomach pain? (e.g., gallstones, recent abdominal surgery)     Hernia  OTHER SYMPTOMS: Do you have any other symptoms? (e.g., back pain, diarrhea, fever, urination pain, vomiting)       A little bit of pain with urination for maybe a year  Protocols used: Abdominal Pain - Male-A-AH
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
## 2024-05-09 ENCOUNTER — Ambulatory Visit (INDEPENDENT_AMBULATORY_CARE_PROVIDER_SITE_OTHER)

## 2024-05-09 ENCOUNTER — Ambulatory Visit

## 2024-05-09 DIAGNOSIS — M67911 Unspecified disorder of synovium and tendon, right shoulder: Secondary | ICD-10-CM | POA: Diagnosis not present

## 2024-05-09 DIAGNOSIS — M25512 Pain in left shoulder: Secondary | ICD-10-CM

## 2024-05-09 DIAGNOSIS — M25511 Pain in right shoulder: Secondary | ICD-10-CM | POA: Diagnosis not present

## 2024-05-09 DIAGNOSIS — M542 Cervicalgia: Secondary | ICD-10-CM

## 2024-05-09 DIAGNOSIS — G8929 Other chronic pain: Secondary | ICD-10-CM | POA: Diagnosis not present

## 2024-05-09 NOTE — Progress Notes (Signed)
 Orthopaedic Surgery New Patient Visit   History of Present Illness: The patient is a 48 y.o. right-hand-dominant male seen in clinic for bilateral shoulder pain.  Patient reports his shoulder pain began after an accident in 2008 while living in [place] .  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  Ever since then, patient states he has had significant pain.  He was initially seen in 2009 in [place]  where he underwent a SLAP repair of his right shoulder.  He states he was not compliant with postoperative rehab.  In 2013, he was evaluated by another orthopedic surgeon who performed an anterior labral repair on the right shoulder.  He notes he was very diligent with postoperative physical therapy and follow-up.  However, his shoulder never felt the same.  He reports constant pain and feeling of his shoulder giving out.  Other associated symptoms include weakness, instability, numbness in both hands. He is unable to tolerate range of motion within a functional range specifically on the right side.  He has tried to return to work in a capacity that does not involve manual labor with the shoulders but has been unable to secure a job that meets these requirements.  He was being seen by pain management doctor in [place]  who was prescribing narcotics for pain control.  He was also taking a lot of Tylenol which he states was upsetting his stomach.  Patient eventually moved to [place]  and tried to apply for disability but has been unsuccessful in doing so.  Patient was seen at another orthopedic institution this year but he states they did not do anything to help him.  He also received MRIs of bilateral shoulders in 2024 and 2025 in [place] .  Unable to view radiology reports or orthopedic encounters associated with these imaging orders.  He was also recently seen by '[facility]' via telehealth based out of [place] . Socially, he  does struggle with mental illness and has been homeless at times over the years which has made things more difficulty.  He recently establish care with primary care provider who has helped guide patient through his physical and mental challenges.  In addition to the referral to our office for evaluation of his shoulders, they have placed a referral for pain management.     Past Medical, Social and Family History: Past Medical History:  Diagnosis Date   Anxiety    Hypertension    Past Surgical History:  Procedure Laterality Date   SHOULDER SURGERY Right    Allergies  Allergen Reactions   Ibuprofen     Nsaids Hives   Current Outpatient Medications on File Prior to Visit  Medication Sig Dispense Refill   acetaminophen (TYLENOL) 500 MG tablet Take 500 mg by mouth every 6 (six) hours as needed. (Patient not taking: Reported on 05/07/2024)     albuterol  (VENTOLIN  HFA) 108 (90 Base) MCG/ACT inhaler Inhale 1-2 puffs into the lungs every 6 (six) hours as needed for wheezing or shortness of breath. 18 g 0   amLODipine  (NORVASC ) 5 MG tablet Take 1 tablet (5 mg total) by mouth daily. 90 tablet 0   Blood Pressure Monitoring (BLOOD PRESSURE MONITOR/ARM) DEVI 1 kit by Does not apply route daily. 1 each 0   cyclobenzaprine  (FLEXERIL ) 10 MG tablet Take 10 mg by mouth 3 (three) times daily as needed for muscle spasms. (Patient not taking: Reported on 05/07/2024)     DULoxetine  (  CYMBALTA ) 60 MG capsule Take 1 capsule (60 mg total) by mouth daily. 90 capsule 0   hydrochlorothiazide  (MICROZIDE ) 12.5 MG capsule Take 1 capsule (12.5 mg total) by mouth daily. 90 capsule 0   traZODone  (DESYREL ) 50 MG tablet Take 1-2 tablets (50-100 mg total) by mouth at bedtime. 120 tablet 0   No current facility-administered medications on file prior to visit.   Social History   Tobacco Use   Smoking status: Every Day    Current packs/day: 0.50    Types: Cigarettes    Passive exposure: Current   Smokeless tobacco:  Never  Vaping Use   Vaping status: Former  Substance Use Topics   Alcohol use: Not Currently   Drug use: No      I have reviewed past medical, surgical, social and family history, medications and allergies as documented in the EMR.  Review of Systems - A ROS was performed including pertinent positives and negatives as documented in the HPI.     Physical Exam:  General/Constitutional: NAD Vascular: No edema, swelling or tenderness, except as noted in detailed exam Integumentary: No impressive skin lesions present, except as noted in detailed exam Neuro/Psych: Frustrated and upset, oriented to person, place and time Musculoskeletal: Normal, except as noted in detailed exam and in HPI   Focused examination:  Neck focused exam: Palpation: Mild tenderness to palpation about the mid-line cervical spine and paraspinal musculature bilaterally.  No palpable step-off deformities.   Shoulder focused exam:  Skin is intact about the bilateral shoulders.  Previous arthroscopy scars well-healed over the right shoulder.  No erythema or ecchymosis noted about the bilateral shoulders.  Bilateral shoulders held in protraction and slumped posture.  Patient guarding significantly on examination of the right shoulder.    RIGHT LEFT  Scapula Atrophy none none   Winging none none  Rotator cuff Supraspinatus 5/5 5/5   Infraspinatus 5/5 5/5   Subscapularis 5/5 5/5  AROM/PROM (degrees) FF 0-100 / 0-140 0-130 / 0-160   ER0 0-60 / 0-60 0-60 / 0-60   ER90 0-60 / 0-90 Not tested   IR(back) Back pocket / L4 L4/ L4  Palpation (pain): AC positive negative   Biceps positive negative   Coracoid negative negative  Special Tests: O'Briens positive negative   Cross body Adduction positive negative   Speeds  negative negative   Jobe's negative negative   Neer positive positive   Hawkins positive positive   Belly Press negative negative  Instability: Apprehension & relocation Positive for pain,  voluntarily subluxing shoulder negative   Load & shift      Anterior guarding Not tested    Posterior guarding Not tested  Other: Kim Test (posterior labrum) Positive for pain Not tested   Lateral deltoid 5/5 5/5    Vascular/Lymphatic: Fingers warm and well perfused with 2+ radial pulse.    Neurologic: Sensation intact to the Median, Ulnar and Radial nerve distribution of the hand. Sensation intact to lateral deltoid (axillary nerve).     XR bilateral shoulder imaging: X-rays of the bilateral shoulders including AP, Grashey, scapular Y, axillary were obtained in office today and were reviewed with patient.  Per my interpretation, there are no fractures or dislocations.  Mild degenerative changes noted about the right glenohumeral joint, most notably along the glenoid rim.  Mild downward sloping acromion bilaterally.  X-ray cervical spine imaging: X-rays of the cervical spine including AP and lateral were obtained in office today and were reviewed with patient.  Per my interpretation,  there are no fractures or dislocations.  Mild to moderate degenerative disc disease at C6-C7 with endplate changes.   Advanced MRI imaging: MRI imaging of the bilateral shoulders were obtained at outside facility [facility]) in 2024 and ([facility]) in 2025 available on disc for review today.  Per my interpretation, there are no fractures or dislocations.  On the right shoulder, there is evidence of AC joint arthritis, postsurgical changes about the superior and anterior glenoid rim, partial-thickness superior rotator cuff tear, mild degenerative changes about the glenohumeral joint, flattening of the long head of the biceps with mild medial subluxation, subscapularis tendinosis and cyst within the humeral head at the greater tuberosity.  On the left shoulder, evidence of tendinopathy within the supra and infraspinatus.  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  Degenerative  fraying of the labrum, flattening of the long head of the biceps and mild glenohumeral degenerative changes.  Radiology Read: No radiology report available  Assessment:  -Right shoulder rotator cuff tendinopathy, subjective instability with voluntary subluxation with history of SLAP repair in 2009 and anterior labral repair in 2013  -Left shoulder rotator cuff impingement  -Cervical spondylosis  Plan:  Patient was seen and examined in office today. We reviewed patient's history, examination, and imaging in detail. Based on information available for this encounter, patient has multiple musculoskeletal ailments that are chronic in nature.  He has a longstanding history of pain in his bilateral shoulders dating back to a work injury in 2008.  Despite several orthopedic surgical interventions in 2009 and 2013, patient has been unable to achieve satisfactory pain relief.  He has been managing his symptoms with medication.  His examination includes significant guarding on the right shoulder but he does demonstrate good strength within his rotator cuff.  Range of motion assessment was limited secondary to guarding.  He does have voluntary subluxation of the right shoulder but passive motion does not exhibit instability in the right shoulder.  Radiographs today as well as 2 prior MRIs in 2024 and 2025 demonstrate degenerative changes within the glenohumeral joint as well as the rotator cuff bilaterally.  Explained to patient that in my opinion there is not an identifiable surgical solution that would provide patient with pain relief and improved function based on evidence available.  We did discuss conservative treatment measures including formal physical therapy and consideration of steroid injection, mainly for his right shoulder today.  Patient politely declined both options.  He states he would like to establish with pain management and discuss solutions via their recommendations.  Referral has already  been placed by primary care provider.  Patient does have radicular symptoms as well as radiographic findings that may suggest cervical pathology.  He would like to have his neck evaluated further.  Referral has been placed for neurosurgery to evaluate.  He does also mention bilateral hip pain during the encounter today.  We would be happy to see him back in 6 weeks to obtain baseline imaging of the hips and discuss potential treatment options moving forward.   All questions, concerns and comments were addressed to the best of my ability.  Follow-up: 6 weeks for evaluation of bilateral hips  Radiographs to be obtained at next visit: Bilateral hip   Arlyss GEANNIE Schneider, DO Orthopedic Surgery & Sports Medicine [place]   This document was dictated using Dragon voice recognition software. A reasonable attempt at proof reading has been made to minimize errors.

## 2024-05-09 NOTE — Patient Instructions (Signed)

## 2024-05-13 ENCOUNTER — Telehealth: Payer: Self-pay | Admitting: Family Medicine

## 2024-05-13 ENCOUNTER — Ambulatory Visit: Admitting: Family Medicine

## 2024-05-13 NOTE — Telephone Encounter (Signed)
 Copied from CRM #[id number]. Topic: Referral - Question >> May 13, 2024 10:24 AM Shanda MATSU wrote: Reason for CRM: Patient wants to know if provider can give him a referral to the [employer] so that the [employer] can provide him with a voucher for clothing, is req a call back in regards to this. >> May 13, 2024 10:31 AM Graeme W wrote: Patient called back states Phone call was disconnected. States he would like it printed for pick up if possible. Thank You

## 2024-05-14 ENCOUNTER — Encounter: Payer: Self-pay | Admitting: General Surgery

## 2024-05-14 ENCOUNTER — Ambulatory Visit (INDEPENDENT_AMBULATORY_CARE_PROVIDER_SITE_OTHER): Admitting: General Surgery

## 2024-05-14 VITALS — BP 122/82 | HR 84 | Ht 73.0 in | Wt 222.0 lb

## 2024-05-14 DIAGNOSIS — M6208 Separation of muscle (nontraumatic), other site: Secondary | ICD-10-CM

## 2024-05-14 NOTE — Patient Instructions (Signed)
 We did not feel any hernia on exam. The area is most like diastasis. This is usually a Therapist, sports and insurance may or may not cover this. If you continue to have symptoms you may want a referral to see them. We can also order imaging to better look at this area.   Follow-up with our office as needed.  Please call and ask to speak with a nurse if you develop questions or concerns.   Diastasis Recti  Diastasis recti is a condition in which the muscles of the abdomen (rectus abdominis muscles) become thin and separate. The result is a wider space between the muscles of the right and left abdomen (abdominal muscles). This wider space between the muscles may cause a bulge in the middle of the abdomen. This bulge may be noticed when a person is straining or when he or she sits up after lying down. Diastasis recti can affect men and women. It is most common among pregnant women, babies, people with obesity, and people who have had abdominal surgery. Exercise or surgery may help correct this condition. What are the causes? Common causes of this condition include: Pregnancy. As the uterus grows in size, it puts pressure on the abdominal muscles, causing the muscles to separate. Obesity. Excess fat puts pressure on abdominal muscles. Weight lifting. Some exercises of the abdomen. Advanced age. Genetics. Having had surgery on the abdomen before. What increases the risk? This condition is more likely to develop in: Women. Newborns, especially newborns who are born early (prematurely). What are the signs or symptoms? Common symptoms of this condition include: A bulge in the middle of your abdomen. You will notice it most when you sit up or strain. Pain in your low back, hips, or the area between your hip bones (pelvis). Constipation. Being unable to control when you urinate (urinary incontinence). Bloating. Poor posture. How is this diagnosed? This condition is diagnosed with a  physical exam. During the exam, your health care provider will ask you to lie flat on your back and do a crunch or half sit-up. If you have diastasis recti, a bulge will appear lengthwise between your abdominal muscles in the center of your abdomen. Your health care provider will measure the gap between your muscles with one of the following: A medical device used to measure the space between two objects (caliper). A tape measure. CT scan. Ultrasound. Finger spaces. Your health care provider will measure the space using his or her fingers. How is this treated? If your muscle separation is not too large, you may not need treatment. However, if you are a woman who plans to become pregnant again, you should treat this condition before your next pregnancy. Treatment may include: Physical therapy exercises to strengthen and tighten your abdominal muscles. Lifestyle changes such as weight loss and exercise. Over-the-counter pain medicines as needed. Surgery to correct the separation. Follow these instructions at home: Activity Return to your normal activities as told by your health care provider. Ask your health care provider what activities are safe for you. Do exercises as told by your health care provider. Make sure you are doing your exercises and movements correctly when lifting weights or doing exercises using your abdominal muscles or the muscles in the center of your body that give stability (core muscles). Proper form can help to prevent this condition from happening again. General instructions If you are overweight, ask your health care provider for help with weight loss. Losing even a small amount of  weight can help to improve your diastasis recti. Take over-the-counter or prescription medicines only as told by your health care provider. Do not strain. Straining can make the separation worse. Examples of straining include: Pushing hard to have a bowel movement, such as when you have  constipation. Lifting heavy objects or lifting children. Standing up and sitting down. You may need to take these actions to prevent or treat constipation: Drink enough fluid to keep your urine pale yellow. Take over-the-counter or prescription medicines. Eat foods that are high in fiber, such as beans, whole grains, and fresh fruits and vegetables. Limit foods that are high in fat and processed sugars, such as fried or sweet foods. Keep all follow-up visits. This is important. Contact a health care provider if: You notice a new bulge in your abdomen. Get help right away if: You experience severe discomfort in your abdomen. You develop severe abdominal pain along with nausea, vomiting, or a fever. Summary Diastasis recti is a condition in which the muscles of the abdomen (rectus abdominismuscles) become thin and separate. You may notice a bulge in your abdomen because the space has widened between the muscles of the right and left abdomen. The most common symptom is a bulge in the middle of your abdomen. You will notice it most when you sit up or strain. This condition is diagnosed with a physical exam. If the muscle separation is not too big, you may not need treatment. Otherwise, you may need to do physical therapy or have surgery. This information is not intended to replace advice given to you by your health care provider. Make sure you discuss any questions you have with your health care provider. Document Revised: 03/14/2023 Document Reviewed: 03/14/2023 Elsevier Patient Education  2024 [employer].

## 2024-05-15 ENCOUNTER — Telehealth: Payer: Self-pay | Admitting: Family Medicine

## 2024-05-15 ENCOUNTER — Telehealth: Payer: Self-pay

## 2024-05-15 NOTE — Telephone Encounter (Signed)
 Patient request a referral to [facility]

## 2024-05-15 NOTE — Telephone Encounter (Signed)
 Called pt to notify him of paperwork pick up. Also informed him per Warren (CMA) he would need his current ortho doctor to send in referral for [facility].

## 2024-05-15 NOTE — Telephone Encounter (Signed)
 I called and talked to the pt. [facility] so he doesn't have to see multiple doctors for his multiple orthopedics problems. I advised pt that he doesn't need a referral to schedule with them and that he should call them to make sure they take his medicaid. He said he would call them and let us  know

## 2024-05-15 NOTE — Progress Notes (Signed)
 Patient ID: James Tapia, male   DOB: 09/17/1975, 48 y.o.   MRN: [id number] CC: Abdominal Pain History of Present Illness James Tapia is a 48 y.o. male with past medical history significant for chronic pain and mental health issues who presents in consultation for abdominal bulge.  The patient reports that he has noticed an abdominal bulge for some time.  He says that when he contracts his abdomen there is a bulge.  He reports that there is a lot of pain from his abdomen that spreads around to his joints.  He denies any overlying skin changes and denies any obstipation symptoms.  He has never had any abdominal surgery.  Past Medical History Past Medical History:  Diagnosis Date   Anxiety    Hypertension        Past Surgical History:  Procedure Laterality Date   SHOULDER SURGERY Right    X 2    Allergies  Allergen Reactions   Ibuprofen  Hives   Nsaids Hives    Current Outpatient Medications  Medication Sig Dispense Refill   albuterol  (VENTOLIN  HFA) 108 (90 Base) MCG/ACT inhaler Inhale 1-2 puffs into the lungs every 6 (six) hours as needed for wheezing or shortness of breath. 18 g 0   amLODipine  (NORVASC ) 5 MG tablet Take 1 tablet (5 mg total) by mouth daily. 90 tablet 0   Blood Pressure Monitoring (BLOOD PRESSURE MONITOR/ARM) DEVI 1 kit by Does not apply route daily. 1 each 0   DULoxetine  (CYMBALTA ) 60 MG capsule Take 1 capsule (60 mg total) by mouth daily. 90 capsule 0   hydrochlorothiazide  (MICROZIDE ) 12.5 MG capsule Take 1 capsule (12.5 mg total) by mouth daily. 90 capsule 0   traZODone  (DESYREL ) 50 MG tablet Take 1-2 tablets (50-100 mg total) by mouth at bedtime. 120 tablet 0   No current facility-administered medications for this visit.    Family History No family history on file.     Social History Social History   Tobacco Use   Smoking status: Former    Current packs/day: 0.50    Types: Cigarettes    Passive exposure: Current   Smokeless tobacco: Never   Vaping Use   Vaping status: Every Day  Substance Use Topics   Alcohol use: Not Currently   Drug use: No        ROS Full ROS of systems performed and is otherwise negative there than what is stated in the HPI  Physical Exam Blood pressure 122/82, pulse 84, height 6' 1 (1.854 m), weight 222 lb (100.7 kg), SpO2 98%.  Alert and oriented x 3, normal work of breathing room air, regular rate and rhythm, abdomen is obese, soft, tender to palpation throughout without any evidence of peritonitis, with flexion of his abdomen there is a midline bulge consistent with diastases recti. Data Reviewed Reviewed his last labs that show a BMP test within normal limits.  I have personally reviewed the patient's imaging and medical records.    Assessment    Patient with abdominal bulge but no evidence of fascial defect on exam.  This is consistent with diastases recti.  Plan    I discussed the pathophysiology of diastases recti with the patient.  We talked about the fact that this is not a true hernia and usually any kind of surgical repair is done by cosmetic surgeon and such as is usually not paid by insurance.  I did discuss with him that if he is extremely concerned about my diagnosis that we can get a  CT scan to ensure that there is no true hernia.  He has elected not to pursue CT scan.  He can follow-up with us  on a as needed basis.  A total of 45 minutes was spent reviewing patient's chart, performing history and physical and discussing treatment options with the patient    Jayson MALVA [facility]

## 2024-05-15 NOTE — Telephone Encounter (Signed)
   Referral Letter up front for pick up. Please call patient to pick up.    Copied from CRM #[id number]. Topic: Referral - Question >> May 13, 2024 10:24 AM Shanda MATSU wrote: Reason for CRM: Patient wants to know if provider can give him a referral to the [employer] so that the [employer] can provide him with a voucher for clothing, is req a call back in regards to this. >> May 14, 2024 10:11 AM Avram MATSU wrote: Will get a callback around lunch time from someone from the office, please advise [phone] >> May 13, 2024 10:31 AM Graeme ORN wrote: Patient called back states Phone call was disconnected. States he would like it printed for pick up if possible. Thank You

## 2024-05-16 ENCOUNTER — Other Ambulatory Visit (HOSPITAL_COMMUNITY): Payer: Self-pay

## 2024-05-16 ENCOUNTER — Other Ambulatory Visit: Payer: Self-pay

## 2024-05-16 DIAGNOSIS — M19011 Primary osteoarthritis, right shoulder: Secondary | ICD-10-CM | POA: Insufficient documentation

## 2024-05-16 MED ORDER — METHYLPREDNISOLONE 4 MG PO TBPK
ORAL_TABLET | ORAL | 0 refills | Status: AC
  Filled 2024-05-16 (×2): qty 21, 6d supply, fill #0

## 2024-05-27 ENCOUNTER — Encounter: Payer: Self-pay | Admitting: Radiology

## 2024-06-04 ENCOUNTER — Ambulatory Visit: Admitting: Family Medicine

## 2024-06-07 ENCOUNTER — Ambulatory Visit: Admitting: Family Medicine

## 2024-06-12 ENCOUNTER — Other Ambulatory Visit: Payer: Self-pay | Admitting: Pharmacist

## 2024-06-12 DIAGNOSIS — I10 Essential (primary) hypertension: Secondary | ICD-10-CM

## 2024-06-12 NOTE — Progress Notes (Signed)
 06/12/2024 Name: James Tapia MRN: [id number] DOB: Mar 05, 1976  Chief Complaint  Patient presents with   Medication Adherence    James Tapia is a 48 y.o. year old male who presented for a telephone visit.   They were referred to the pharmacist by their PCP for assistance in managing hypertension and medication access.      Subjective:   Care Team: Primary Care Provider: Towana Small, FNP  Medication Access/Adherence  Current Pharmacy:  [facility] [address] [place] - [address] [place] [address] Phone: [phone] Fax: [phone]  [facility] [address] [place] [address] Phone: [phone] Fax: [phone]   Patient reports affordability concerns with their medications: No  Patient reports access/transportation concerns to their pharmacy: No  Patient reports adherence concerns with their medications:  No     Reports plans to change primary care providers. Scheduled for appointment with NP Darice Petty at [facility] on 07/09/2024  Using weekly pillbox as adherence aid. Reports rarely misses a dose of his medications now    Hypertension:   Current medications:  - amlodipine  5 mg daily - HCTZ 12.5 mg daily   Patient has an automated, upper arm home BP cuff. Denies checking at home in past week  Reports chronic pain from shoulder and knees that notices elevates his blood sugar sometimes - Reports working with Orthopedics regarding his pain   Admits to adding salt to his food when cooks, but not when eating. Reports eating fast food sometimes    Current physical activity: walking his dog a couple of miles/day     Tobacco Abuse:   Denies smoking recently, but currently vaping and uses cannabis   Not ready to quit vaping now due to stress level   Objective:  Lab Results  Component Value Date   CREATININE 1.07 12/21/2023   BUN 8  12/21/2023   NA 142 12/21/2023   K 3.8 12/21/2023   CL 101 12/21/2023   CO2 23 12/21/2023   BP Readings from Last 3 Encounters:  05/14/24 122/82  05/07/24 (!) 138/90  01/01/24 (!) 157/112   Pulse Readings from Last 3 Encounters:  05/14/24 84  05/07/24 92  01/01/24 80     Medications Reviewed Today     Reviewed by Alana Sharyle LABOR, RPH-CPP (Pharmacist) on 06/12/24 at 1023  Med List Status: <None>   Medication Order Taking? Sig Documenting Provider Last Dose Status Informant  albuterol  (VENTOLIN  HFA) 108 (90 Base) MCG/ACT inhaler [id number]  Inhale 1-2 puffs into the lungs every 6 (six) hours as needed for wheezing or shortness of breath. Towana Small, FNP  Active   amLODipine  (NORVASC ) 5 MG tablet [id number] Yes Take 1 tablet (5 mg total) by mouth daily. Towana Small, FNP  Active   Blood Pressure Monitoring (BLOOD PRESSURE MONITOR/ARM) DEVI [id number]  1 kit by Does not apply route daily. Towana Small, FNP  Active   DULoxetine  (CYMBALTA ) 60 MG capsule [id number]  Take 1 capsule (60 mg total) by mouth daily. Towana Small, FNP  Active   hydrochlorothiazide  (MICROZIDE ) 12.5 MG capsule [id number] Yes Take 1 capsule (12.5 mg total) by mouth daily. Towana Small, FNP  Active   traZODone  (DESYREL ) 50 MG tablet [id number]  Take 1-2 tablets (50-100 mg total) by mouth at bedtime. Towana Small, FNP  Active               Assessment/Plan:    Discuss importance of  maintaining medication adherence. Encourage to continue to use weekly pillbox  Hypertension: - Reviewed long term cardiovascular and renal outcomes of uncontrolled blood pressure - Reviewed appropriate blood pressure monitoring technique and reviewed goal blood pressure. - Recommended to restart checking home blood pressure and heart rate, keep log of results and have this record to review at upcoming medical appointments. Patient to contact provider office sooner if needed for readings outside of  established parameters or symptoms     Follow Up Plan:    Patient denies further medication questions or concerns today Patient interested in working with pharmacist at [facility] once he established care there. Will send message to PharmD Channing Mealing to ask her to follow up with him in January.     Sharyle Sia, PharmD, [facility] [phone]

## 2024-06-12 NOTE — Patient Instructions (Signed)
Check your blood pressure twice weekly, and any time you have concerning symptoms like headache, chest pain, dizziness, shortness of breath, or vision changes.   Our goal is less than 130/80.  To appropriately check your blood pressure, make sure you do the following:  1) Avoid caffeine, exercise, or tobacco products for 30 minutes before checking. Empty your bladder. 2) Sit with your back supported in a flat-backed chair. Rest your arm on something flat (arm of the chair, table, etc). 3) Sit still with your feet flat on the floor, resting, for at least 5 minutes.  4) Check your blood pressure. Take 1-2 readings.  5) Write down these readings and bring with you to any provider appointments.  Bring your home blood pressure machine with you to a provider's office for accuracy comparison at least once a year.   Make sure you take your blood pressure medications before you come to any office visit, even if you were asked to fast for labs.  Estelle Grumbles, PharmD, [facility] [phone]

## 2024-06-14 NOTE — Progress Notes (Signed)
 No show

## 2024-06-24 ENCOUNTER — Ambulatory Visit: Admitting: Physician Assistant

## 2024-06-24 DIAGNOSIS — Z91199 Patient's noncompliance with other medical treatment and regimen due to unspecified reason: Secondary | ICD-10-CM

## 2024-07-09 ENCOUNTER — Encounter: Admitting: Nurse Practitioner

## 2024-07-09 NOTE — Progress Notes (Deleted)
° °  There were no vitals taken for this visit.   Subjective:    Patient ID: James Tapia, male    DOB: Dec 21, 1975, 48 y.o.   MRN: [id number]  HPI: James Tapia is a 48 y.o. male  No chief complaint on file.  Patient presents to clinic to establish care with new PCP.  Introduced to publishing rights manager role and practice setting.  All questions answered.  Discussed provider/patient relationship and expectations.  Patient reports a history of ***. Patient denies a history of: Hypertension, Elevated Cholesterol, Diabetes, Thyroid  problems, Depression, Anxiety, Neurological problems, and Abdominal problems.   HYPERTENSION {Blank single:19197::without,with} Chronic Kidney Disease Hypertension status: {Blank single:19197::controlled,uncontrolled,better,worse,exacerbated,stable}  Satisfied with current treatment? {Blank single:19197::yes,no} Duration of hypertension: {Blank single:19197::chronic,months,years} BP monitoring frequency:  {Blank single:19197::not checking,rarely,daily,weekly,monthly,a few times a day,a few times a week,a few times a month} BP range:  BP medication side effects:  {Blank single:19197::yes,no} Medication compliance: {Blank single:19197::excellent compliance,good compliance,fair compliance,poor compliance} Previous BP meds:{Blank multiple:19196::none,amlodipine ,amlodipine /benazepril,atenolol,benazepril,benazepril/HCTZ,bisoprolol (bystolic),carvedilol,chlorthalidone,clonidine,diltiazem,exforge HCT,HCTZ,irbesartan (avapro),labetalol,lisinopril,lisinopril-HCTZ,losartan (cozaar),methyldopa,nifedipine,olmesartan (benicar),olmesartan-HCTZ,quinapril,ramipril,spironalactone,tekturna,valsartan,valsartan-HCTZ,verapamil} Aspirin: {Blank single:19197::yes,no} Recurrent headaches: {Blank single:19197::yes,no} Visual changes: {Blank  single:19197::yes,no} Palpitations: {Blank single:19197::yes,no} Dyspnea: {Blank single:19197::yes,no} Chest pain: {Blank single:19197::yes,no} Lower extremity edema: {Blank single:19197::yes,no} Dizzy/lightheaded: {Blank single:19197::yes,no}  MOOD  Review of Systems  Per HPI unless specifically indicated above     Objective:    There were no vitals taken for this visit.  Wt Readings from Last 3 Encounters:  05/14/24 222 lb (100.7 kg)  05/07/24 219 lb (99.3 kg)  01/01/24 226 lb (102.5 kg)    Physical Exam  Results for orders placed or performed in visit on 12/21/23  Basic Metabolic Panel (BMET)   Collection Time: 12/21/23 11:07 AM  Result Value Ref Range   Glucose 95 70 - 99 mg/dL   BUN 8 6 - 24 mg/dL   Creatinine, Ser 8.92 0.76 - 1.27 mg/dL   eGFR 86 >40 fO/fpw/8.26   BUN/Creatinine Ratio 7 (L) 9 - 20   Sodium 142 134 - 144 mmol/L   Potassium 3.8 3.5 - 5.2 mmol/L   Chloride 101 96 - 106 mmol/L   CO2 23 20 - 29 mmol/L   Calcium 9.2 8.7 - 10.2 mg/dL      Assessment & Plan:   Problem List Items Addressed This Visit       Cardiovascular and Mediastinum   Primary hypertension     Other   GAD (generalized anxiety disorder)   MDD (major depressive disorder), recurrent episode, moderate (HCC) - Primary     Follow up plan: No follow-ups on file.

## 2024-07-12 NOTE — Progress Notes (Deleted)
 "  Referring Physician:  Gust Molly, DO [address] [place],  [place] [address]  Primary Physician:  Towana Small, FNP  History of Present Illness: 07/12/2024 [name] is here today with a chief complaint of ***  Neck pain that radiates to bilateral shoulders and instability, weakness and numbness in both hands.   Duration: *** Location: *** Quality: *** Severity: ***  Precipitating: aggravated by *** Modifying factors: made better by *** Weakness: none Timing: *** Bowel/Bladder Dysfunction: none  Conservative measures:  Physical therapy: *** has not participated in PT for neck Multimodal medical therapy including regular antiinflammatories: *** Cymbalta  Injections: no epidural steroid injections  Past Surgery: ***none  James Tapia has ***no symptoms of cervical myelopathy.  The symptoms are causing a significant impact on the patient's life.   Review of Systems:  A 10 point review of systems is negative, except for the pertinent positives and negatives detailed in the HPI.  Past Medical History: Past Medical History:  Diagnosis Date   Anxiety    Hypertension     Past Surgical History: Past Surgical History:  Procedure Laterality Date   SHOULDER SURGERY Right    X 2    Allergies: Allergies as of 07/22/2024 - Review Complete 05/14/2024  Allergen Reaction Noted   Ibuprofen  Hives 04/20/2015   Nsaids Hives 02/02/2015    Medications: Outpatient Encounter Medications as of 07/22/2024  Medication Sig   albuterol  (VENTOLIN  HFA) 108 (90 Base) MCG/ACT inhaler Inhale 1-2 puffs into the lungs every 6 (six) hours as needed for wheezing or shortness of breath.   amLODipine  (NORVASC ) 5 MG tablet Take 1 tablet (5 mg total) by mouth daily.   Blood Pressure Monitoring (BLOOD PRESSURE MONITOR/ARM) DEVI 1 kit by Does not apply route daily.   DULoxetine  (CYMBALTA ) 60 MG capsule Take 1 capsule (60 mg total) by mouth daily.    hydrochlorothiazide  (MICROZIDE ) 12.5 MG capsule Take 1 capsule (12.5 mg total) by mouth daily.   traZODone  (DESYREL ) 50 MG tablet Take 1-2 tablets (50-100 mg total) by mouth at bedtime.   No facility-administered encounter medications on file as of 07/22/2024.    Social History: Social History[1]  Family Medical History: No family history on file.  Physical Examination: @VITALWITHPAIN @  General: Patient is well developed, well nourished, calm, collected, and in no apparent distress. Attention to examination is appropriate.  Psychiatric: Patient is non-anxious.  Head:  Pupils equal, round, and reactive to light.  ENT:  Oral mucosa appears well hydrated.  Neck:   Supple.  ***Full range of motion.  Respiratory: Patient is breathing without any difficulty.  Extremities: No edema.  Vascular: Palpable dorsal pedal pulses.  Skin:   On exposed skin, there are no abnormal skin lesions.  NEUROLOGICAL:     Awake, alert, oriented to person, place, and time.  Speech is clear and fluent. Fund of knowledge is appropriate.   Cranial Nerves: Pupils equal round and reactive to light.  Facial tone is symmetric.  Facial sensation is symmetric.  ROM of spine: ***full.  Palpation of spine: ***non tender.    Strength: Side Biceps Triceps Deltoid Interossei Grip Wrist Ext. Wrist Flex.  R 5 5 5 5 5 5 5   L 5 5 5 5 5 5 5    Side Iliopsoas Quads Hamstring PF DF EHL  R 5 5 5 5 5 5   L 5 5 5 5 5 5    Reflexes are ***2+ and symmetric at the biceps, triceps, brachioradialis, patella and achilles.  Hoffman's is absent.  Clonus is not present.  Toes are down-going.  Bilateral upper and lower extremity sensation is intact to light touch.    Gait is normal.   No difficulty with tandem gait.   No evidence of dysmetria noted.  Medical Decision Making  Imaging: ***  I have personally reviewed the images and agree with the above interpretation.  Assessment and Plan: [name] is a pleasant 48  y.o. male with ***    Thank you for involving me in the care of this patient.   I spent a total of *** minutes in both face-to-face and non-face-to-face activities for this visit on the date of this encounter.   James Tapia Dept. of Neurosurgery      [1]  Social History Tobacco Use   Smoking status: Former    Current packs/day: 0.50    Types: Cigarettes    Passive exposure: Current   Smokeless tobacco: Never  Vaping Use   Vaping status: Every Day  Substance Use Topics   Alcohol use: Not Currently   Drug use: No   "

## 2024-07-22 ENCOUNTER — Ambulatory Visit: Admitting: Neurosurgery

## 2024-08-08 ENCOUNTER — Other Ambulatory Visit: Payer: Self-pay

## 2024-08-08 NOTE — Progress Notes (Signed)
" ° °  08/08/2024  Patient ID: James Tapia, male   DOB: 1976/01/14, 49 y.o.   MRN: [id number]  Outreach to check in with patient recently scheduled to transfer care to PCP at [facility].  Patient was followed by clinical pharmacist at [facility] office for management of hypertension.  Mr. [name] missed his visit with [facility], but he is seeing [facility] again tomorrow for a physical and prescription refills.  He does not endorse needing any further assistance with medication at this time.  I will review chart notes after tomorrow's PCP visit to see where patient plans to continue/establish care.  Channing DELENA Mealing, PharmD, DPLA  "

## 2024-08-09 ENCOUNTER — Encounter: Payer: Self-pay | Admitting: Family Medicine

## 2024-08-09 ENCOUNTER — Other Ambulatory Visit: Payer: Self-pay

## 2024-08-09 ENCOUNTER — Ambulatory Visit (INDEPENDENT_AMBULATORY_CARE_PROVIDER_SITE_OTHER): Admitting: Family Medicine

## 2024-08-09 VITALS — BP 124/87 | HR 85 | Resp 16 | Ht 73.0 in | Wt 223.0 lb

## 2024-08-09 DIAGNOSIS — M25474 Effusion, right foot: Secondary | ICD-10-CM

## 2024-08-09 DIAGNOSIS — F411 Generalized anxiety disorder: Secondary | ICD-10-CM

## 2024-08-09 DIAGNOSIS — F331 Major depressive disorder, recurrent, moderate: Secondary | ICD-10-CM | POA: Diagnosis not present

## 2024-08-09 DIAGNOSIS — M25475 Effusion, left foot: Secondary | ICD-10-CM

## 2024-08-09 DIAGNOSIS — I10 Essential (primary) hypertension: Secondary | ICD-10-CM | POA: Diagnosis not present

## 2024-08-09 DIAGNOSIS — Z0000 Encounter for general adult medical examination without abnormal findings: Secondary | ICD-10-CM

## 2024-08-09 DIAGNOSIS — Z1211 Encounter for screening for malignant neoplasm of colon: Secondary | ICD-10-CM | POA: Diagnosis not present

## 2024-08-09 DIAGNOSIS — Z1212 Encounter for screening for malignant neoplasm of rectum: Secondary | ICD-10-CM

## 2024-08-09 DIAGNOSIS — M25472 Effusion, left ankle: Secondary | ICD-10-CM | POA: Diagnosis not present

## 2024-08-09 DIAGNOSIS — Z125 Encounter for screening for malignant neoplasm of prostate: Secondary | ICD-10-CM

## 2024-08-09 DIAGNOSIS — M25471 Effusion, right ankle: Secondary | ICD-10-CM

## 2024-08-09 MED ORDER — AMLODIPINE BESYLATE 5 MG PO TABS
5.0000 mg | ORAL_TABLET | Freq: Every day | ORAL | 3 refills | Status: AC
  Filled 2024-08-09: qty 90, 90d supply, fill #0

## 2024-08-09 MED ORDER — ALBUTEROL SULFATE HFA 108 (90 BASE) MCG/ACT IN AERS
1.0000 | INHALATION_SPRAY | Freq: Four times a day (QID) | RESPIRATORY_TRACT | 0 refills | Status: AC | PRN
  Filled 2024-08-09: qty 18, 25d supply, fill #0

## 2024-08-09 MED ORDER — HYDROCHLOROTHIAZIDE 12.5 MG PO CAPS
12.5000 mg | ORAL_CAPSULE | Freq: Every day | ORAL | 3 refills | Status: AC
  Filled 2024-08-09: qty 90, 90d supply, fill #0

## 2024-08-09 MED ORDER — DULOXETINE HCL 60 MG PO CPEP
60.0000 mg | ORAL_CAPSULE | Freq: Every day | ORAL | 3 refills | Status: AC
  Filled 2024-08-09: qty 90, 90d supply, fill #0

## 2024-08-09 NOTE — Patient Instructions (Signed)
 Health Maintenance Recommendations Screening Testing Colon Cancer Screening Every 1-10 years based on test performed, risk factors, and history Starting at age 49 Bone Density Screening Every 2-10 years based on history Starting at age 89 for women Recommendations for men differ based on medication usage, history, and risk factors AAA Screening One time ultrasound Men 64-41 years old who have every smoked Lung Cancer Screening Low Dose Lung CT every 12 months Age 37-80 years with a 30 pack-year smoking history who still smoke or who have quit within the last 15 years   Screening Labs Routine  Labs: Complete Blood Count (CBC), Complete Metabolic Panel (CMP), Cholesterol (Lipid Panel) Every 6-12 months based on history and medications May be recommended more frequently based on current conditions or previous results Hemoglobin A1c Lab Every 3-12 months based on history and previous results Starting at age 63 or earlier with diagnosis of diabetes, high cholesterol, BMI >26, and/or risk factors Frequent monitoring for patients with diabetes to ensure blood sugar control Thyroid Panel (TSH w/ T3 & T4) Every 6 months based on history, symptoms, and risk factors May be repeated more often if on medication HIV One time testing for all patients 48 and older May be repeated more frequently for patients with increased risk factors or exposure Hepatitis C One time testing for all patients 57 and older May be repeated more frequently for patients with increased risk factors or exposure Gonorrhea, Chlamydia Every 12 months for all sexually active persons 13-24 years Additional monitoring may be recommended for those who are considered high risk or who have symptoms PSA Men 48-51 years old with risk factors Additional screening may be recommended from age 26-69 based on risk factors, symptoms, and history   Vaccine Recommendations Tetanus Booster All adults every 10 years Flu Vaccine All  patients 6 months and older every year COVID Vaccine All patients 12 years and older Initial dosing with booster May recommend additional booster based on age and health history HPV Vaccine 2 doses all patients age 32-26 Dosing may be considered for patients over 26 Shingles Vaccine (Shingrix) 2 doses all adults 55 years and older Pneumonia (Pneumovax 23) All adults 65 years and older May recommend earlier dosing based on health history Pneumonia (Prevnar 9) All adults 65 years and older Dosed 1 year after Pneumovax 23   Additional Screening, Testing, and Vaccinations may be recommended on an individualized basis based on family history, health history, risk factors, and/or exposure.  __________________________________________________________   Diet Recommendations for All Patients   I recommend that all patients maintain a diet low in saturated fats, carbohydrates, and cholesterol. While this can be challenging at first, it is not impossible and small changes can make big differences.  Things to try: Decreasing the amount of soda, sweet tea, and/or juice to one or less per day and replace with water While water is always the first choice, if you do not like water you may consider adding a water additive without sugar to improve the taste other sugar free drinks Replace potatoes with a brightly colored vegetable at dinner Use healthy oils, such as canola oil or olive oil, instead of butter or hard margarine Limit your bread intake to two pieces or less a day Replace regular pasta with low carb pasta options Bake, broil, or grill foods instead of frying Monitor portion sizes  Eat smaller, more frequent meals throughout the day instead of large meals   An important thing to remember is, if you love foods that  are not great for your health, you don't have to give them up completely. Instead, allow these foods to be a reward when you have done well. Allowing yourself to still have  special treats every once in a while is a nice way to tell yourself thank you for working hard to keep yourself healthy.    Also remember that every day is a new day. If you have a bad day and fall off the wagon, you can still climb right back up and keep moving along on your journey!   We have resources available to help you!  Some websites that may be helpful include: www.http://www.wall-moore.info/        Www.VeryWellFit.com _____________________________________________________________   Activity Recommendations for All Patients   I recommend that all adults get at least 20 minutes of moderate physical activity that elevates your heart rate at least 5 days out of the week.  Some examples include: Walking or jogging at a pace that allows you to carry on a conversation Cycling (stationary bike or outdoors) Water aerobics Yoga Weight lifting Dancing If physical limitations prevent you from putting stress on your joints, exercise in a pool or seated in a chair are excellent options.   Do determine your MAXIMUM heart rate for activity: YOUR AGE - 220 = MAX HeartRate    Remember! Do not push yourself too hard.  Start slowly and build up your pace, speed, weight, time in exercise, etc.  Allow your body to rest between exercise and get good sleep. You will need more water than normal when you are exerting yourself. Do not wait until you are thirsty to drink. Drink with a purpose of getting in at least 8, 8 ounce glasses of water a day plus more depending on how much you exercise and sweat.      If you begin to develop dizziness, chest pain, abdominal pain, jaw pain, shortness of breath, headache, vision changes, lightheadedness, or other concerning symptoms, stop the activity and allow your body to rest. If your symptoms are severe, seek emergency evaluation immediately. If your symptoms are concerning, but not severe, please let us  know so that we can recommend further evaluation.

## 2024-08-09 NOTE — Progress Notes (Signed)
 "  Subjective:   James Tapia October 02, 1975 08/09/2024  CC: Chief Complaint  Patient presents with   Annual Exam   Discussed the use of AI scribe software for clinical note transcription with the patient, who gave verbal consent to proceed.  History of Present Illness   James Tapia is a 49 y.o. male who presents for a routine health maintenance exam. Fasting labs collected at time of visit.   He is approaching [age] and is interested in having certain health parameters checked as part of his routine health maintenance. He has a family history of colon cancer, with his father being diagnosed at an older age.  He quit smoking almost a year ago after having smoked since he was around 25 or [age] years old. His smoking history includes periods of smoking up to two packs a day, averaging about a pack a day over the years.  He reports a family history of prostate cancer, with his paternal grandfather having had the condition. He experiences some urinary symptoms, describing it as 'slow to come out,' but no other significant urinary issues.  He has been taking Cymbalta  but experienced difficulties when he ran out of it while in [place] . He has stopped taking trazodone  as he is getting decent sleep without it.  His mother had a history of small cell lung cancer, initially diagnosed as stage four, but she became cancer-free after participating in a clinical trial. However, she later developed dementia, which he attributes to treatment-related complications.     HEALTH SCREENINGS: - Vision Screening: up to date - Dental Visits: up to date - Testicular Exam: Declined - STD Screening: Declined  - PSA (50+): Not applicable  - Colonoscopy (45+): Ordered today  Discussed with patient purpose of the colonoscopy is to detect colon cancer at curable precancerous or early stages  - AAA Screening: Not applicable Men age 46-75 who have ever smoked - Lung Cancer screening with low-dose CT: Not  applicable Adults age 15-80 who are current cigarette smokers or quit within the last 15 years. Must have 20 pack year history.   Depression and Anxiety Screen done today and results listed below:     08/09/2024    9:55 AM 05/07/2024    2:30 PM 01/01/2024    3:28 PM 12/21/2023   10:22 AM  Depression screen PHQ 2/9  Decreased Interest 1 2 1 3   Down, Depressed, Hopeless 1 2 1 3   PHQ - 2 Score 2 4 2 6   Altered sleeping 1 2 3 3   Tired, decreased energy 1 2 3 3   Change in appetite 1 2 3 3   Feeling bad or failure about yourself  1 3 2 3   Trouble concentrating 1 2 3 3   Moving slowly or fidgety/restless 1 2 1    Suicidal thoughts 0 0 0 1  PHQ-9 Score 8 17  17  22    Difficult doing work/chores  Extremely dIfficult Very difficult Very difficult     Data saved with a previous flowsheet row definition      08/09/2024    9:55 AM 05/07/2024    2:30 PM 01/01/2024    3:29 PM 12/21/2023   10:23 AM  GAD 7 : Generalized Anxiety Score  Nervous, Anxious, on Edge 1 2 3 3   Control/stop worrying 1 2 2 3   Worry too much - different things 1 2 2 3   Trouble relaxing 1 2 3 3   Restless 1 1 2 3   Easily annoyed or irritable 1 2 1  3  Afraid - awful might happen 1 1 1 3   Total GAD 7 Score 7 12 14 21   Anxiety Difficulty   Very difficult Very difficult    IMMUNIZATIONS:  - Tdap: Tetanus vaccination status reviewed: last tetanus booster within 10 years. - Influenza: Refused - Pneumovax: Refused - Prevnar: Refused - Shingrix vaccine (50+): Refused   Past medical history, surgical history, medications, allergies, family history and social history reviewed with patient today and changes made to appropriate areas of the chart.   Past Medical History:  Diagnosis Date   Anxiety    Hypertension     Past Surgical History:  Procedure Laterality Date   SHOULDER SURGERY Right    X 2    Current Outpatient Medications on File Prior to Visit  Medication Sig   Blood Pressure Monitoring (BLOOD PRESSURE  MONITOR/ARM) DEVI 1 kit by Does not apply route daily.   No current facility-administered medications on file prior to visit.    Allergies[1]   Social History   Socioeconomic History   Marital status: Unknown    Spouse name: Not on file   Number of children: Not on file   Years of education: Not on file   Highest education level: 11th grade  Occupational History   Not on file  Tobacco Use   Smoking status: Former    Current packs/day: 0.00    Average packs/day: 0.5 packs/day for 35.0 years (17.5 ttl pk-yrs)    Types: Cigarettes    Start date: 21    Quit date: 2025    Years since quitting: 1.0    Passive exposure: Current   Smokeless tobacco: Never  Vaping Use   Vaping status: Every Day  Substance and Sexual Activity   Alcohol use: Not Currently   Drug use: No   Sexual activity: Not on file  Other Topics Concern   Not on file  Social History Narrative   Not on file   Social Drivers of Health   Tobacco Use: Medium Risk (08/09/2024)   Patient History    Smoking Tobacco Use: Former    Smokeless Tobacco Use: Never    Passive Exposure: Current  Physicist, Medical Strain: High Risk (01/01/2024)   Overall Financial Resource Strain (CARDIA)    Difficulty of Paying Living Expenses: Very hard  Food Insecurity: Food Insecurity Present (01/01/2024)   Hunger Vital Sign    Worried About Running Out of Food in the Last Year: Sometimes true    Ran Out of Food in the Last Year: Sometimes true  Transportation Needs: No Transportation Needs (01/01/2024)   PRAPARE - Administrator, Civil Service (Medical): No    Lack of Transportation (Non-Medical): No  Physical Activity: Unknown (01/01/2024)   Exercise Vital Sign    Days of Exercise per Week: 0 days    Minutes of Exercise per Session: Not on file  Stress: Stress Concern Present (01/01/2024)   [employer] of Occupational Health - Occupational Stress Questionnaire    Feeling of Stress : Very much  Social Connections:  Socially Isolated (01/01/2024)   Social Connection and Isolation Panel    Frequency of Communication with Friends and Family: More than three times a week    Frequency of Social Gatherings with Friends and Family: Twice a week    Attends Religious Services: Never    Database Administrator or Organizations: No    Attends Engineer, Structural: Not on file    Marital Status: Separated  Intimate Partner Violence:  Not on file  Depression (PHQ2-9): Medium Risk (08/09/2024)   Depression (PHQ2-9)    PHQ-2 Score: 8  Alcohol Screen: Not on file  Housing: High Risk (01/01/2024)   Housing Stability Vital Sign    Unable to Pay for Housing in the Last Year: Yes    Number of Times Moved in the Last Year: Not on file    Homeless in the Last Year: Yes  Utilities: Not on file  Health Literacy: Not on file   Tobacco Use History[2] Social History   Substance and Sexual Activity  Alcohol Use Not Currently   Family History  Problem Relation Age of Onset   Lung cancer Mother        small cell   Dementia Mother    Colon cancer Father    ROS: Denies fever, fatigue, unexplained weight loss/gain, CP, SHOB, and palpatitations. Denies neurological deficits, gastrointestinal and/or genitourinary complaints, and skin changes.   Objective:   Today's Vitals   08/09/24 0954  BP: 124/87  Pulse: 85  Resp: 16  SpO2: 95%  Weight: 223 lb (101.2 kg)  Height: 6' 1 (1.854 m)  PainSc: 0-No pain    GENERAL APPEARANCE: Well-appearing, in NAD. Well nourished.  SKIN: Pink, warm and dry. Turgor normal. No rash, lesion, ulceration, or ecchymoses. Hair evenly distributed.  HEENT: HEAD: Normocephalic.  EYES: PERRLA. EOMI. Lids intact w/o defect. Sclera white, Conjunctiva pink w/o exudate.  EARS: External ear w/o redness, swelling, masses or lesions. EAC clear. TM's intact, translucent w/o bulging, appropriate landmarks visualized. Appropriate acuity to conversational tones.  NOSE: Septum midline w/o  deformity. Nares patent, mucosa pink and non-inflamed w/o drainage. No sinus tenderness.  THROAT: Uvula midline. Oropharynx clear. Tonsils non-inflamed w/o exudate. Oral mucosa pink and moist.  NECK: Supple, Trachea midline. Full ROM w/o pain or tenderness. No lymphadenopathy. Thyroid  non-tender w/o enlargement or palpable masses.  RESPIRATORY: Chest wall symmetrical w/o masses. Respirations even and non-labored. Breath sounds clear to auscultation bilaterally. No wheezes, rales, rhonchi, or crackles. CARDIAC: S1, S2 present, regular rate and rhythm. No gallops, murmurs, rubs, or clicks. PMI w/o lifts, heaves, or thrills. No carotid bruits. Capillary refill <2 seconds. Peripheral pulses 2+ bilaterally. GI: Abdomen soft w/o distention. Normoactive bowel sounds. No palpable masses or tenderness. No guarding or rebound tenderness. Liver and spleen w/o tenderness or enlargement. No CVA tenderness.  GU: Deferred exam. MSK: Muscle tone and strength appropriate for age, w/o atrophy or abnormal movement. EXTREMITIES: Active ROM intact, w/o tenderness, crepitus, or contracture. No obvious joint deformities or effusions. No clubbing, edema, or cyanosis.  NEUROLOGIC: CN's II-XII intact. Motor strength symmetrical with no obvious weakness. No sensory deficits. DTR 2+ symmetric bilaterally. Steady, even gait.  PSYCH/MENTAL STATUS: Alert, oriented x 3. Cooperative, appropriate mood and affect.   Results for orders placed or performed in visit on 12/21/23  Basic Metabolic Panel (BMET)   Collection Time: 12/21/23 11:07 AM  Result Value Ref Range   Glucose 95 70 - 99 mg/dL   BUN 8 6 - 24 mg/dL   Creatinine, Ser 8.92 0.76 - 1.27 mg/dL   eGFR 86 >40 fO/fpw/8.26   BUN/Creatinine Ratio 7 (L) 9 - 20   Sodium 142 134 - 144 mmol/L   Potassium 3.8 3.5 - 5.2 mmol/L   Chloride 101 96 - 106 mmol/L   CO2 23 20 - 29 mmol/L   Calcium 9.2 8.7 - 10.2 mg/dL    Assessment & Plan:   1. Wellness examination (Primary) -  Encouraged to adjust  caloric intake to maintain or achieve ideal body weight, to reduce intake of dietary saturated fat and total fat, to limit sodium intake by avoiding high sodium foods and not adding table salt, and to maintain adequate dietary potassium and calcium preferably from fresh fruits, vegetables, and low-fat dairy products.   - Advised to avoid cigarette smoking. - Discussed with the patient that most people either abstain from alcohol or drink within safe limits (<=14/week and <=4 drinks/occasion for males, <=7/weeks and <= 3 drinks/occasion for females) and that the risk for alcohol disorders and other health effects rises proportionally with the number of drinks per week and how often a drinker exceeds daily limits. - Discussed cessation/primary prevention of drug use and availability of treatment for abuse.  - Stressed the importance of regular exercise - Injury prevention: Discussed safety belts, safety helmets, smoke detector, smoking near bedding or upholstery.  - Dental health: Discussed importance of regular tooth brushing, flossing, and dental visits.  - Sexuality: Discussed sexually transmitted diseases, partner selection, use of condoms, avoidance of unintended pregnancy  and contraceptive alternatives.  NEXT PREVENTATIVE PHYSICAL DUE IN 1 YEAR.  2. Primary hypertension Refills sent to the pharmacy.  - amLODipine  (NORVASC ) 5 MG tablet; Take 1 tablet (5 mg total) by mouth daily.  Dispense: 90 tablet; Refill: 3 - hydrochlorothiazide  (MICROZIDE ) 12.5 MG capsule; Take 1 capsule (12.5 mg total) by mouth daily.  Dispense: 90 capsule; Refill: 3  3. GAD (generalized anxiety disorder) Refills sent to the pharmacy.  - DULoxetine  (CYMBALTA ) 60 MG capsule; Take 1 capsule (60 mg total) by mouth daily.  Dispense: 90 capsule; Refill: 3  4. MDD (major depressive disorder), recurrent episode, moderate (HCC) Refills sent to the pharmacy.  - DULoxetine  (CYMBALTA ) 60 MG capsule; Take 1  capsule (60 mg total) by mouth daily.  Dispense: 90 capsule; Refill: 3  5. Bilateral swelling of feet and ankles Refills sent to the pharmacy.  - hydrochlorothiazide  (MICROZIDE ) 12.5 MG capsule; Take 1 capsule (12.5 mg total) by mouth daily.  Dispense: 90 capsule; Refill: 3  6. Screening for colorectal cancer Family history of colon cancer. Patient agreeable to receive screening colonoscopy.  - Ambulatory referral to Gastroenterology  7. Screening for malignant neoplasm of prostate The natural history of prostate cancer and ongoing controversy regarding screening and potential treatment outcomes of prostate cancer has been discussed with the patient. The meaning of a false positive PSA and a false negative PSA has been discussed. He indicates understanding of the limitations of this screening test and wishes to proceed with screening PSA testing. - PSA  8. Healthcare maintenance Will obtain baseline labs today.  - CBC with Differential/Platelet - Comprehensive metabolic panel with GFR - Hemoglobin A1c - Lipid panel - TSH Rfx on Abnormal to Free T4   Return in about 6 months (around 02/06/2025) for HTN follow-up.  Patient to reach out to office if new, worrisome, or unresolved symptoms arise or if no improvement in patient's condition. Patient verbalized understanding and is agreeable to treatment plan. All questions answered to patient's satisfaction.    Evalene Arts, FNP     [1]  Allergies Allergen Reactions   Ibuprofen  Hives   Nsaids Hives  [2]  Social History Tobacco Use  Smoking Status Former   Current packs/day: 0.00   Average packs/day: 0.5 packs/day for 35.0 years (17.5 ttl pk-yrs)   Types: Cigarettes   Start date: 68   Quit date: 2025   Years since quitting: 1.0   Passive exposure:  Current  Smokeless Tobacco Never   "

## 2024-08-10 LAB — CBC WITH DIFFERENTIAL/PLATELET
Basophils Absolute: 0.1 x10E3/uL (ref 0.0–0.2)
Basos: 1 %
EOS (ABSOLUTE): 0.2 x10E3/uL (ref 0.0–0.4)
Eos: 2 %
Hematocrit: 49.3 % (ref 37.5–51.0)
Hemoglobin: 16.3 g/dL (ref 13.0–17.7)
Immature Grans (Abs): 0 x10E3/uL (ref 0.0–0.1)
Immature Granulocytes: 0 %
Lymphocytes Absolute: 2.2 x10E3/uL (ref 0.7–3.1)
Lymphs: 24 %
MCH: 28.9 pg (ref 26.6–33.0)
MCHC: 33.1 g/dL (ref 31.5–35.7)
MCV: 87 fL (ref 79–97)
Monocytes Absolute: 1 x10E3/uL — ABNORMAL HIGH (ref 0.1–0.9)
Monocytes: 10 %
Neutrophils Absolute: 6 x10E3/uL (ref 1.4–7.0)
Neutrophils: 63 %
Platelets: 216 x10E3/uL (ref 150–450)
RBC: 5.64 x10E6/uL (ref 4.14–5.80)
RDW: 13.6 % (ref 11.6–15.4)
WBC: 9.5 x10E3/uL (ref 3.4–10.8)

## 2024-08-10 LAB — COMPREHENSIVE METABOLIC PANEL WITH GFR
ALT: 20 IU/L (ref 0–44)
AST: 20 IU/L (ref 0–40)
Albumin: 4.6 g/dL (ref 4.1–5.1)
Alkaline Phosphatase: 67 IU/L (ref 47–123)
BUN/Creatinine Ratio: 11 (ref 9–20)
BUN: 12 mg/dL (ref 6–24)
Bilirubin Total: 0.3 mg/dL (ref 0.0–1.2)
CO2: 24 mmol/L (ref 20–29)
Calcium: 9.3 mg/dL (ref 8.7–10.2)
Chloride: 101 mmol/L (ref 96–106)
Creatinine, Ser: 1.12 mg/dL (ref 0.76–1.27)
Globulin, Total: 2.2 g/dL (ref 1.5–4.5)
Glucose: 88 mg/dL (ref 70–99)
Potassium: 4.1 mmol/L (ref 3.5–5.2)
Sodium: 141 mmol/L (ref 134–144)
Total Protein: 6.8 g/dL (ref 6.0–8.5)
eGFR: 81 mL/min/1.73

## 2024-08-10 LAB — HEMOGLOBIN A1C
Est. average glucose Bld gHb Est-mCnc: 108 mg/dL
Hgb A1c MFr Bld: 5.4 % (ref 4.8–5.6)

## 2024-08-10 LAB — LIPID PANEL
Chol/HDL Ratio: 4.3 ratio (ref 0.0–5.0)
Cholesterol, Total: 206 mg/dL — ABNORMAL HIGH (ref 100–199)
HDL: 48 mg/dL
LDL Chol Calc (NIH): 146 mg/dL — ABNORMAL HIGH (ref 0–99)
Triglycerides: 68 mg/dL (ref 0–149)
VLDL Cholesterol Cal: 12 mg/dL (ref 5–40)

## 2024-08-10 LAB — TSH RFX ON ABNORMAL TO FREE T4: TSH: 0.967 u[IU]/mL (ref 0.450–4.500)

## 2024-08-10 LAB — PSA: Prostate Specific Ag, Serum: 0.9 ng/mL (ref 0.0–4.0)

## 2024-08-14 ENCOUNTER — Ambulatory Visit: Payer: Self-pay | Admitting: Family Medicine

## 2024-08-15 ENCOUNTER — Telehealth: Payer: Self-pay

## 2024-08-15 ENCOUNTER — Other Ambulatory Visit: Payer: Self-pay

## 2024-08-15 DIAGNOSIS — Z800 Family history of malignant neoplasm of digestive organs: Secondary | ICD-10-CM

## 2024-08-15 DIAGNOSIS — Z1211 Encounter for screening for malignant neoplasm of colon: Secondary | ICD-10-CM

## 2024-08-15 MED ORDER — GOLYTELY 236 G PO SOLR
4000.0000 mL | Freq: Once | ORAL | 0 refills | Status: AC
  Filled 2024-08-15: qty 4000, 1d supply, fill #0

## 2024-08-15 NOTE — Telephone Encounter (Signed)
 Gastroenterology Pre-Procedure Review  Request Date: 09/27/24 Requesting Physician: Dr. THEOPHILUS  PATIENT REVIEW QUESTIONS: The patient responded to the following health history questions as indicated:    1. Are you having any GI issues? no 2. Do you have a personal history of Polyps? no 3. Do you have a family history of Colon Cancer or Polyps? yes (FATHER COLON CANCER) 4. Diabetes Mellitus? no 5. Joint replacements in the past 12 months?no 6. Major health problems in the past 3 months?no 7. Any artificial heart valves, MVP, or defibrillator?no    MEDICATIONS & ALLERGIES:    Patient reports the following regarding taking any anticoagulation/antiplatelet therapy:   Plavix, Coumadin, Eliquis, Xarelto, Lovenox, Pradaxa, Brilinta, or Effient? no Aspirin? no  Patient confirms/reports the following medications:  Current Outpatient Medications  Medication Sig Dispense Refill   Blood Pressure Monitoring (BLOOD PRESSURE MONITOR/ARM) DEVI 1 kit by Does not apply route daily. 1 each 0   albuterol  (VENTOLIN  HFA) 108 (90 Base) MCG/ACT inhaler Inhale 1-2 puffs into the lungs every 6 (six) hours as needed for wheezing or shortness of breath. 18 g 0   amLODipine  (NORVASC ) 5 MG tablet Take 1 tablet (5 mg total) by mouth daily. 90 tablet 3   DULoxetine  (CYMBALTA ) 60 MG capsule Take 1 capsule (60 mg total) by mouth daily. 90 capsule 3   hydrochlorothiazide  (MICROZIDE ) 12.5 MG capsule Take 1 capsule (12.5 mg total) by mouth daily. 90 capsule 3   No current facility-administered medications for this visit.    Patient confirms/reports the following allergies:  Allergies[1]  No orders of the defined types were placed in this encounter.   AUTHORIZATION INFORMATION Primary Insurance: 1D#: Group #:  Secondary Insurance: 1D#: Group #:  SCHEDULE INFORMATION: Date: 09/27/24 Time: Location: [facility]    [1]  Allergies Allergen Reactions   Ibuprofen  Hives   Nsaids Hives

## 2024-09-18 ENCOUNTER — Ambulatory Visit: Admitting: Pain Medicine

## 2024-09-27 ENCOUNTER — Ambulatory Visit: Admit: 2024-09-27

## 2024-09-27 SURGERY — COLONOSCOPY
Anesthesia: General

## 2025-02-06 ENCOUNTER — Ambulatory Visit: Admitting: Family Medicine
